# Patient Record
Sex: Male | Born: 1950
Health system: Southern US, Community
[De-identification: ages and names within clinical notes are randomized; demographics above are authoritative.]

## PROBLEM LIST (undated history)

## (undated) DIAGNOSIS — E785 Hyperlipidemia, unspecified: Secondary | ICD-10-CM

## (undated) DIAGNOSIS — I10 Essential (primary) hypertension: Secondary | ICD-10-CM

## (undated) DIAGNOSIS — I2510 Atherosclerotic heart disease of native coronary artery without angina pectoris: Secondary | ICD-10-CM

## (undated) HISTORY — DX: Hyperlipidemia, unspecified: E78.5

## (undated) HISTORY — DX: Atherosclerotic heart disease of native coronary artery without angina pectoris: I25.10

## (undated) HISTORY — DX: Essential (primary) hypertension: I10

---

## 2010-09-10 ENCOUNTER — Inpatient Hospital Stay (INDEPENDENT_AMBULATORY_CARE_PROVIDER_SITE_OTHER)
Admission: RE | Admit: 2010-09-10 | Discharge: 2010-09-10 | Disposition: A | Discharge status: Home / Self Care | Payer: 59 | Source: Ambulatory Visit | Attending: Family Medicine | Admitting: Family Medicine

## 2010-09-10 DIAGNOSIS — T6391XA Toxic effect of contact with unspecified venomous animal, accidental (unintentional), initial encounter: Secondary | ICD-10-CM

## 2010-09-10 DIAGNOSIS — L988 Other specified disorders of the skin and subcutaneous tissue: Secondary | ICD-10-CM

## 2016-06-10 DIAGNOSIS — Z0000 Encounter for general adult medical examination without abnormal findings: Secondary | ICD-10-CM | POA: Diagnosis not present

## 2016-06-21 DIAGNOSIS — Z1212 Encounter for screening for malignant neoplasm of rectum: Secondary | ICD-10-CM | POA: Diagnosis not present

## 2016-06-21 DIAGNOSIS — Z1211 Encounter for screening for malignant neoplasm of colon: Secondary | ICD-10-CM | POA: Diagnosis not present

## 2017-07-20 DIAGNOSIS — T161XXA Foreign body in right ear, initial encounter: Secondary | ICD-10-CM | POA: Diagnosis not present

## 2017-11-03 DIAGNOSIS — Z0000 Encounter for general adult medical examination without abnormal findings: Secondary | ICD-10-CM | POA: Diagnosis not present

## 2017-11-03 DIAGNOSIS — R030 Elevated blood-pressure reading, without diagnosis of hypertension: Secondary | ICD-10-CM | POA: Diagnosis not present

## 2017-12-20 DIAGNOSIS — H903 Sensorineural hearing loss, bilateral: Secondary | ICD-10-CM | POA: Diagnosis not present

## 2017-12-28 DIAGNOSIS — H903 Sensorineural hearing loss, bilateral: Secondary | ICD-10-CM | POA: Diagnosis not present

## 2018-08-08 DIAGNOSIS — T161XXD Foreign body in right ear, subsequent encounter: Secondary | ICD-10-CM | POA: Diagnosis not present

## 2018-11-09 DIAGNOSIS — Z125 Encounter for screening for malignant neoplasm of prostate: Secondary | ICD-10-CM | POA: Diagnosis not present

## 2018-11-09 DIAGNOSIS — Z0000 Encounter for general adult medical examination without abnormal findings: Secondary | ICD-10-CM | POA: Diagnosis not present

## 2019-01-03 DIAGNOSIS — U071 COVID-19: Secondary | ICD-10-CM | POA: Diagnosis not present

## 2019-01-03 DIAGNOSIS — J069 Acute upper respiratory infection, unspecified: Secondary | ICD-10-CM | POA: Diagnosis not present

## 2019-04-11 DIAGNOSIS — R69 Illness, unspecified: Secondary | ICD-10-CM | POA: Diagnosis not present

## 2019-06-14 ENCOUNTER — Emergency Department (HOSPITAL_COMMUNITY): Payer: Medicare HMO

## 2019-06-14 ENCOUNTER — Inpatient Hospital Stay (HOSPITAL_COMMUNITY)
Admission: EM | Admit: 2019-06-14 | Discharge: 2019-06-16 | DRG: 247 | Disposition: A | Discharge status: Home / Self Care | Payer: Medicare HMO | Attending: Family Medicine | Admitting: Family Medicine

## 2019-06-14 DIAGNOSIS — Z79899 Other long term (current) drug therapy: Secondary | ICD-10-CM

## 2019-06-14 DIAGNOSIS — I25110 Atherosclerotic heart disease of native coronary artery with unstable angina pectoris: Secondary | ICD-10-CM | POA: Diagnosis not present

## 2019-06-14 DIAGNOSIS — Z20822 Contact with and (suspected) exposure to covid-19: Secondary | ICD-10-CM | POA: Diagnosis present

## 2019-06-14 DIAGNOSIS — I10 Essential (primary) hypertension: Secondary | ICD-10-CM | POA: Diagnosis not present

## 2019-06-14 DIAGNOSIS — R001 Bradycardia, unspecified: Secondary | ICD-10-CM | POA: Diagnosis present

## 2019-06-14 DIAGNOSIS — R079 Chest pain, unspecified: Secondary | ICD-10-CM | POA: Diagnosis not present

## 2019-06-14 DIAGNOSIS — R0789 Other chest pain: Secondary | ICD-10-CM | POA: Diagnosis not present

## 2019-06-14 DIAGNOSIS — I200 Unstable angina: Secondary | ICD-10-CM | POA: Diagnosis not present

## 2019-06-14 DIAGNOSIS — I2584 Coronary atherosclerosis due to calcified coronary lesion: Secondary | ICD-10-CM | POA: Diagnosis present

## 2019-06-14 DIAGNOSIS — E782 Mixed hyperlipidemia: Secondary | ICD-10-CM | POA: Diagnosis not present

## 2019-06-14 DIAGNOSIS — I440 Atrioventricular block, first degree: Secondary | ICD-10-CM | POA: Diagnosis not present

## 2019-06-14 DIAGNOSIS — R0609 Other forms of dyspnea: Secondary | ICD-10-CM | POA: Diagnosis not present

## 2019-06-14 DIAGNOSIS — R072 Precordial pain: Secondary | ICD-10-CM

## 2019-06-14 DIAGNOSIS — R0602 Shortness of breath: Secondary | ICD-10-CM | POA: Diagnosis present

## 2019-06-14 DIAGNOSIS — Z955 Presence of coronary angioplasty implant and graft: Secondary | ICD-10-CM

## 2019-06-14 DIAGNOSIS — E785 Hyperlipidemia, unspecified: Secondary | ICD-10-CM | POA: Diagnosis present

## 2019-06-14 DIAGNOSIS — Z823 Family history of stroke: Secondary | ICD-10-CM

## 2019-06-14 DIAGNOSIS — Z7982 Long term (current) use of aspirin: Secondary | ICD-10-CM

## 2019-06-14 DIAGNOSIS — R0600 Dyspnea, unspecified: Secondary | ICD-10-CM | POA: Diagnosis not present

## 2019-06-14 DIAGNOSIS — I214 Non-ST elevation (NSTEMI) myocardial infarction: Secondary | ICD-10-CM

## 2019-06-14 LAB — CBC WITH DIFFERENTIAL/PLATELET
Abs Immature Granulocytes: 0.02 10*3/uL (ref 0.00–0.07)
Basophils Absolute: 0 10*3/uL (ref 0.0–0.1)
Basophils Relative: 1 %
Eosinophils Absolute: 0.1 10*3/uL (ref 0.0–0.5)
Eosinophils Relative: 1 %
HCT: 48.2 % (ref 39.0–52.0)
Hemoglobin: 15.7 g/dL (ref 13.0–17.0)
Immature Granulocytes: 0 %
Lymphocytes Relative: 37 %
Lymphs Abs: 2.7 10*3/uL (ref 0.7–4.0)
MCH: 29.9 pg (ref 26.0–34.0)
MCHC: 32.6 g/dL (ref 30.0–36.0)
MCV: 91.8 fL (ref 80.0–100.0)
Monocytes Absolute: 0.6 10*3/uL (ref 0.1–1.0)
Monocytes Relative: 8 %
Neutro Abs: 3.9 10*3/uL (ref 1.7–7.7)
Neutrophils Relative %: 53 %
Platelets: 242 10*3/uL (ref 150–400)
RBC: 5.25 MIL/uL (ref 4.22–5.81)
RDW: 12.6 % (ref 11.5–15.5)
WBC: 7.3 10*3/uL (ref 4.0–10.5)
nRBC: 0 % (ref 0.0–0.2)

## 2019-06-14 LAB — CK TOTAL AND CKMB (NOT AT ARMC)
CK, MB: 1.9 ng/mL (ref 0.5–5.0)
Relative Index: INVALID (ref 0.0–2.5)
Total CK: 90 U/L (ref 49–397)

## 2019-06-14 LAB — BASIC METABOLIC PANEL
Anion gap: 9 (ref 5–15)
BUN: 17 mg/dL (ref 8–23)
CO2: 22 mmol/L (ref 22–32)
Calcium: 9.9 mg/dL (ref 8.9–10.3)
Chloride: 107 mmol/L (ref 98–111)
Creatinine, Ser: 1.03 mg/dL (ref 0.61–1.24)
GFR calc Af Amer: >60 mL/min (ref >60)
GFR calc non Af Amer: >60 mL/min (ref >60)
Glucose, Bld: 94 mg/dL (ref 70–99)
Potassium: 4.2 mmol/L (ref 3.5–5.1)
Sodium: 138 mmol/L (ref 135–145)

## 2019-06-14 LAB — TROPONIN I (HIGH SENSITIVITY)
Troponin I (High Sensitivity): 22 ng/L — ABNORMAL HIGH (ref <18)
Troponin I (High Sensitivity): 27 ng/L — ABNORMAL HIGH (ref <18)
Troponin I (High Sensitivity): 22 ng/L — ABNORMAL HIGH (ref <18)
Troponin I (High Sensitivity): 26 ng/L — ABNORMAL HIGH (ref <18)

## 2019-06-14 LAB — HEMOGLOBIN A1C
Hgb A1c MFr Bld: 5.7 % — ABNORMAL HIGH (ref 4.8–5.6)
Mean Plasma Glucose: 116.89 mg/dL

## 2019-06-14 LAB — LIPID PANEL
Cholesterol: 236 mg/dL — ABNORMAL HIGH (ref 0–200)
HDL: 52 mg/dL (ref >40)
LDL Cholesterol: 170 mg/dL — ABNORMAL HIGH (ref 0–99)
Total CHOL/HDL Ratio: 4.5 RATIO
Triglycerides: 69 mg/dL (ref <150)
VLDL: 14 mg/dL (ref 0–40)

## 2019-06-14 LAB — TSH: TSH: 1.224 u[IU]/mL (ref 0.350–4.500)

## 2019-06-14 LAB — MAGNESIUM: Magnesium: 2.1 mg/dL (ref 1.7–2.4)

## 2019-06-14 LAB — HIV ANTIBODY (ROUTINE TESTING W REFLEX): HIV Screen 4th Generation wRfx: NONREACTIVE

## 2019-06-14 LAB — MRSA PCR SCREENING: MRSA by PCR: NEGATIVE

## 2019-06-14 MED ORDER — AMLODIPINE BESYLATE 5 MG PO TABS
5.0000 mg | ORAL_TABLET | Freq: Every day | ORAL | Status: DC
  Administered 2019-06-15 – 2019-06-16 (×2): 5 mg via ORAL
  Filled 2019-06-14 (×2): qty 1

## 2019-06-14 MED ORDER — SODIUM CHLORIDE 0.9% FLUSH: 3.0000 mL | INTRAVENOUS | Status: DC | PRN

## 2019-06-14 MED ORDER — AMLODIPINE BESYLATE 5 MG PO TABS
2.5000 mg | ORAL_TABLET | Freq: Every day | ORAL | Status: DC
  Administered 2019-06-14: 2.5 mg via ORAL
  Filled 2019-06-14: qty 1

## 2019-06-14 MED ORDER — HEPARIN (PORCINE) 25000 UT/250ML-% IV SOLN
1100.0000 [IU]/h | INTRAVENOUS | Status: DC
  Administered 2019-06-14: 1100 [IU]/h via INTRAVENOUS
  Filled 2019-06-14: qty 250

## 2019-06-14 MED ORDER — AMLODIPINE BESYLATE 5 MG PO TABS: 2.5000 mg | ORAL_TABLET | Freq: Every day | ORAL | Status: DC

## 2019-06-14 MED ORDER — SODIUM CHLORIDE 0.9 % WEIGHT BASED INFUSION
3.0000 mL/kg/h | INTRAVENOUS | Status: AC
  Administered 2019-06-15: 3 mL/kg/h via INTRAVENOUS

## 2019-06-14 MED ORDER — SODIUM CHLORIDE 0.9 % WEIGHT BASED INFUSION
1.0000 mL/kg/h | INTRAVENOUS | Status: DC
  Administered 2019-06-15: 1 mL/kg/h via INTRAVENOUS

## 2019-06-14 MED ORDER — SODIUM CHLORIDE 0.9% FLUSH
3.0000 mL | Freq: Two times a day (BID) | INTRAVENOUS | Status: DC
  Administered 2019-06-14 – 2019-06-15 (×2): 3 mL via INTRAVENOUS

## 2019-06-14 MED ORDER — HEPARIN SODIUM (PORCINE) 5000 UNIT/ML IJ SOLN
5000.0000 [IU] | Freq: Three times a day (TID) | INTRAMUSCULAR | Status: DC
  Administered 2019-06-14: 5000 [IU] via SUBCUTANEOUS
  Filled 2019-06-14: qty 1

## 2019-06-14 MED ORDER — SODIUM CHLORIDE 0.9 % IV SOLN: 250.0000 mL | INTRAVENOUS | Status: DC | PRN

## 2019-06-14 MED ORDER — ATORVASTATIN CALCIUM 80 MG PO TABS
80.0000 mg | ORAL_TABLET | Freq: Every day | ORAL | Status: DC
  Administered 2019-06-14 – 2019-06-16 (×3): 80 mg via ORAL
  Filled 2019-06-14 (×3): qty 1

## 2019-06-14 MED ORDER — HEPARIN BOLUS VIA INFUSION
1000.0000 [IU] | Freq: Once | INTRAVENOUS | Status: AC
  Administered 2019-06-14: 1000 [IU] via INTRAVENOUS
  Filled 2019-06-14: qty 1000

## 2019-06-14 MED ORDER — ATORVASTATIN CALCIUM 80 MG PO TABS: 80.0000 mg | ORAL_TABLET | Freq: Every day | ORAL | Status: DC

## 2019-06-14 MED ORDER — ENOXAPARIN SODIUM 40 MG/0.4ML ~~LOC~~ SOLN: 40.0000 mg | SUBCUTANEOUS | Status: DC

## 2019-06-14 MED ORDER — ASPIRIN 81 MG PO CHEW
81.0000 mg | CHEWABLE_TABLET | ORAL | Status: AC
  Administered 2019-06-15: 81 mg via ORAL
  Filled 2019-06-14: qty 1

## 2019-06-14 MED ORDER — ASPIRIN EC 81 MG PO TBEC
81.0000 mg | DELAYED_RELEASE_TABLET | Freq: Every day | ORAL | Status: DC
  Administered 2019-06-16: 81 mg via ORAL
  Filled 2019-06-14 (×3): qty 1

## 2019-06-14 NOTE — Progress Notes (Signed)
ANTICOAGULATION CONSULT NOTE  Pharmacy Consult for Heparin Indication: chest pain/ACS  Allergies  Allergen Reactions  . Bee Venom     Patient Measurements: Height: 5\' 10"  (177.8 cm) Weight: 91.4 kg (201 lb 8 oz) IBW/kg (Calculated) : 73 Heparin Dosing Weight: 91.3  Vital Signs: Temp: 97.7 F (36.5 C) (04/22 1319) Temp Source: Oral (04/22 1319) BP: 163/100 (04/22 1815) Pulse Rate: 51 (04/22 1845)  Labs: Recent Labs    06/14/19 1359 06/14/19 1630  HGB 15.7  --   HCT 48.2  --   PLT 242  --   CREATININE 1.03  --   TROPONINIHS 22* 27*    Estimated Creatinine Clearance: 78.1 mL/min (by C-G formula based on SCr of 1.03 mg/dL).   Medical History: No past medical history on file.  Medications:  Scheduled:  . [START ON 06/15/2019] amLODipine  5 mg Oral Daily  . [START ON 06/15/2019] aspirin EC  81 mg Oral Daily  . atorvastatin  80 mg Oral Daily  . heparin  1,000 Units Intravenous Once    Assessment: Patient is a 58 yom that is being admitted for chest pain. Patient is planned for a cath tomorrow. Pharmacy has been asked to dose heparin at this time for chest pain/ACS.  Goal of Therapy:  Heparin level 0.3-0.7 units/ml Monitor platelets by anticoagulation protocol: Yes   Plan:  - 5000 units of SubQ heparin was administered at ~ 1800, with this will give a significantly reduced bolus of heparin - Heparin bolus 1000 units IV x 1 dose - Heparin drip @ 1100 units/hr - Heparin level in ~ 6 hours  - Monitor patient for s/s of bleeding and CBC while on heparin   73 PharmD. BCPS  06/14/2019,7:18 PM

## 2019-06-14 NOTE — H&P (Addendum)
Family Medicine [facility] Admission History and Physical Service Pager: [phone]  Patient name: Joseph Jenkins Medical record number: [id number] Date of birth: 08-10-1950 Age: 69 y.o. Gender: male  Primary Care Provider: Barbie Banner, MD Consultants: None Code Status: Full Preferred Emergency Contact: Marcelino Scot [phone]  Chief Complaint: Chest pain, SOB  Assessment and Plan: Joseph Jenkins is a 69 y.o. male presenting with exertional chest pain/SOB ongoing x 1 week. PMH is significant for Hyperlipidemia not any antihypertensives.  Exertional Chest Pain/SOB c/f Unstable Angina: Reports one week history of worsening exertional chest pressure and SOB that occurred with short distances, lasting 1-2 mins and relieved with rest. Most concerning differential include unstable angina. Risk factors include HTN and HLD - labs significant for slight increase in Troponin 22, LDL 170, Chol 236 and HbA1c 5.7.  EKG shows sinus brady without ST elevation or depression.  Chest xray negative for active disease. TIMI score 4 which indicates 20% risk at 14 days all-cause mortality; Heart Score 4 indicating moderate risk of an adverse cardiac advent.  Denies any chest pain or SOB currently. No chest tenderness on palpation. Other differentials considered but determined to be less consistent with H&P include GERD, esophageal spasm, prinzmetal. Cards consulted. -Admit to Cardiac Tele, Attending Dr. Pollie Meyer -Cardiology consult, appreciated recommendations  -Start Amlodipine 5mg  daily per cards recs, No beta blockers -Consult pharm for Heparin gtt per Cards -Plan for heart cath in am -Start Atorvastatin 80 mg daily -Trend Troponin's -CK and CKMB, TSH -continuous cardiac monitoring -Vital signs per unit -Repeat EKG in am - ASA  HTN: BP elevated 140-150's/80's on admission. No prior history of HTN. Discussed with cardiology who agreed with starting Amlodipine for it's antihypertensive  and antianginal effect. Avoid beta-blockers given underlying bradycardia. - start Amlodipine 5mg  QD  HLD: Lipid panel notable for Chol 236, HDL 52, LDL 170, Trig 69. Not currently on any anti-cholesterol medications. - Start Atorvastatin 80mg  QD  FEN/GI: -Heart Healthy -NPO after midnight Prophylaxis:  -Heparin dtt  Disposition: Cardiac Tele, Attending Dr.  History of Present Illness:  Joseph Jenkins is a 69 y.o. male presenting with worsening exertional chest pain and SOB. He notes it all started about 6-7 days ago when he was working in the yard. He experienced some chest pain and worsening SOB. He notes the chest pain occurred about 5-10 mins into his exertion. Denies any diaphoresis, nausea, vomiting, arm/jaw pain. He has had several more occurences in the following days when he was working in the yard and when he went for a walk with his wife. Patient notes he was working in the yard on [place] and had similar symptoms that occurred with less exertion, then again on Tuesday on a walk with his wife. Notes that the pain occurs across the top of his chest, "feels like a burning, and pressure in my chest". Felt extremely short of breath - had to breath heavy for air.  Once he stopped exerting himself it would resolve in about 1-2 mins. Denies any association with food. Denies any chest pain at rest. Denies history of anemia. Denies any history of thyroid issues.  He notes that he is a 73 at [employer]. He does not take any medications. Is pretty active. He is a never smoker. Denies any illicit drug use. Denies any history of pulmonary diseases such as asthma.   Surgical history: Excision of lipoma from neck Family History: stroke in father in 75's  He had COVID in November 2020. He  has not had the vaccine.   Review Of Systems: Per HPI with the following additions:   Review of Systems  Constitutional: Negative for chills, diaphoresis, fatigue and fever.  HENT: Negative  for congestion and sore throat.   Respiratory: Positive for chest tightness and shortness of breath. Negative for cough and wheezing.   Cardiovascular: Positive for chest pain.  Gastrointestinal: Negative for constipation, diarrhea, nausea and vomiting.  Genitourinary: Negative for hematuria.  Neurological: Negative for dizziness and headaches.     Patient Active Problem List   Diagnosis Date Noted  . SOB (shortness of breath) on exertion 06/14/2019    Past Medical History: No past medical history on file.  Past Surgical History: Excision of Lipoma   Social History: Social History   Tobacco Use  . Smoking status: Not on file  Substance Use Topics  . Alcohol use: Not on file  . Drug use: Not on file   Additional social history: Nonsmoker, no alcohol, no illicit drugs Please also refer to relevant sections of EMR.  Family History: Father: stroke at 14's  Allergies and Medications: Allergies  Allergen Reactions  . Bee Venom    No current facility-administered medications on file prior to encounter.   Current Outpatient Medications on File Prior to Encounter  Medication Sig Dispense Refill  . aspirin EC 81 MG tablet Take 81 mg by mouth daily.    . Lactobacillus (PROBIOTIC ACIDOPHILUS PO) Take 1 tablet by mouth.      Objective: BP (!) 189/93   Pulse (!) 50   Temp 97.7 F (36.5 C) (Oral)   Resp 17   SpO2 100%  Exam: General: Alert and oriented, no apparent distress  Eyes: PEERLA ENTM: No pharyengeal erythema Neck: nontender, no elevated JVD or carotid bruits appreciated Cardiovascular: Regular rhythm and SB with no murmurs or gallops appreciated Respiratory: CTA bilaterally  Gastrointestinal: Bowel sounds present. No abdominal pain MSK: Upper extremity strength 5/5 bilaterally, Lower extremity strength 5/5 bilaterally  Psych: Behavior and speech appropriate to situation  Labs and Imaging: CBC BMET  Recent Labs  Lab 06/14/19 1359  WBC 7.3  HGB 15.7   HCT 48.2  PLT 242   Recent Labs  Lab 06/14/19 1359  NA 138  K 4.2  CL 107  CO2 22  BUN 17  CREATININE 1.03  GLUCOSE 94  CALCIUM 9.9     EKG: Sinus brady, no signs of ischemia  DG Chest 2 View  Result Date: 06/14/2019 CLINICAL DATA:  Chest pain and shortness of breath for 5-6 days. EXAM: CHEST - 2 VIEW COMPARISON:  None. FINDINGS: Lungs clear. Heart size normal. No pneumothorax or pleural effusion. No acute or focal bony abnormality. IMPRESSION: Negative chest. Electronically Signed   By: Inge Rise M.D.   On: 06/14/2019 14:39    Carollee Leitz, MD 06/14/2019, 4:44 PM PGY-1, [place] Intern pager: [phone], text pages welcome  Upper Level Addendum:  I have seen and evaluated this patient along with Dr. Volanda Napoleon and reviewed the above note, making necessary revisions in green.   Mina Marble, D.O. 06/14/2019, 9:04 PM PGY-2, [place]

## 2019-06-14 NOTE — Consult Note (Addendum)
Cardiology Consultation:   Patient ID: Joseph Jenkins MRN: [id number]; DOB: Nov 17, 1950  Admit date: 06/14/2019 Date of Consult: 06/14/2019  Primary Care Provider: Barbie Banner, MD Primary Cardiologist: New patient/Dr Delton See  Patient Profile:   Joseph Jenkins is a 69 y.o. male with a hx of hypertension who is being seen today for the evaluation of chest pain at the request of Dr. Dana Allan.  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  Patient states he was in his usual state of health until last Thursday when he started to develop exertional chest pressure and shortness of breath after walking very short distances.  Does resolve at rest.  Ever since then any exertion leads to retrosternal chest pressure necessity to stop.  Prior to this he was very active going to the gym and walking about 6 miles a week with his wife and was completely asymptomatic.  Patient does not have a family history of premature coronary artery disease and has never smoked.  He has a history of hypertension treated with amlodipine, he recently had his cholesterol checked and was told it was within normal limits.  He has not had any recent other medical issues, no fever chills nausea vomiting lower extremity edema orthopnea or proximal nocturnal dyspnea.  No palpitations or syncope.   No past medical history on file.  Inpatient Medications: Scheduled Meds: . amLODipine  2.5 mg Oral Daily  . [START ON 06/15/2019] aspirin EC  81 mg Oral Daily  . heparin  5,000 Units Subcutaneous Q8H   Continuous Infusions:  PRN Meds:   Allergies:    Allergies  Allergen Reactions  . Bee Venom     Social History:   Social History   Socioeconomic History  . Marital status: Married    Spouse name: Not on file  . Number of children: Not on  file  . Years of education: Not on file  . Highest education level: Not on file  Occupational History  . Not on file  Tobacco Use  . Smoking status: Not on file  Substance and Sexual Activity  . Alcohol use: Not on file  . Drug use: Not on file  . Sexual activity: Not on file  Other Topics Concern  . Not on file  Social History Narrative  . Not on file   Social Determinants of Health   Financial Resource Strain:   . Difficulty of Paying Living Expenses:   Food Insecurity:   . Worried About Programme researcher, broadcasting/film/video in the Last Year:   . Barista in the Last Year:   Transportation Needs:   . Freight forwarder (Medical):   Marland Kitchen Lack of Transportation (Non-Medical):   Physical Activity:   . Days of Exercise per Week:   . Minutes of Exercise per Session:   Stress:   . Feeling of Stress :   Social Connections:   . Frequency of Communication with Friends and Family:   . Frequency of Social Gatherings with Friends and Family:   . Attends Religious Services:   . Active Member of Clubs or Organizations:   . Attends Banker Meetings:   Marland Kitchen Marital Status:   Intimate Partner Violence:   . Fear of Current or Ex-Partner:   . Emotionally Abused:   Marland Kitchen Physically Abused:   .  Sexually Abused:     Family History:   No family history of coronary artery disease or sudden cardiac death.  ROS:  Please see the history of present illness.  All other ROS reviewed and negative.     Physical Exam/Data:   Vitals:   06/14/19 1630 06/14/19 1645 06/14/19 1700 06/14/19 1815  BP: (!) 146/79 (!) 162/96 (!) 151/83 (!) 163/100  Pulse: (!) 51 (!) 50 (!) 49 (!) 54  Resp:      Temp:      TempSrc:      SpO2: 100% 96% 95% 100%   No intake or output data in the 24 hours ending 06/14/19 1847 No flowsheet data found.   There is no height or weight on file to calculate BMI.  General:  Well nourished, well developed, in no acute distress HEENT: normal Lymph: no adenopathy Neck: no  JVD Endocrine:  No thryomegaly Vascular: No carotid bruits; FA pulses 2+ bilaterally without bruits  Cardiac:  normal S1, S2; RRR; no murmur  Lungs:  clear to auscultation bilaterally, no wheezing, rhonchi or rales  Abd: soft, nontender, no hepatomegaly  Ext: no edema Musculoskeletal:  No deformities, BUE and BLE strength normal and equal Skin: warm and dry  Neuro:  CNs 2-12 intact, no focal abnormalities noted Psych:  Normal affect   EKG:  The EKG was personally reviewed and demonstrates: Sinus bradycardia with nonspecific ST-T abnormalities. Telemetry:  Telemetry was personally reviewed and demonstrates:  SB to SR  Relevant CV Studies: Laboratory Data:  High Sensitivity Troponin:   Recent Labs  Lab 06/14/19 1359  TROPONINIHS 22*     Chemistry Recent Labs  Lab 06/14/19 1359  NA 138  K 4.2  CL 107  CO2 22  GLUCOSE 94  BUN 17  CREATININE 1.03  CALCIUM 9.9  GFRNONAA >60  GFRAA >60  ANIONGAP 9    No results for input(s): PROT, ALBUMIN, AST, ALT, ALKPHOS, BILITOT in the last 168 hours. Hematology Recent Labs  Lab 06/14/19 1359  WBC 7.3  RBC 5.25  HGB 15.7  HCT 48.2  MCV 91.8  MCH 29.9  MCHC 32.6  RDW 12.6  PLT 242   BNPNo results for input(s): BNP, PROBNP in the last 168 hours.  DDimer No results for input(s): DDIMER in the last 168 hours.   Radiology/Studies:  DG Chest 2 View  Result Date: 06/14/2019 CLINICAL DATA:  Chest pain and shortness of breath for 5-6 days. EXAM: CHEST - 2 VIEW COMPARISON:  None. FINDINGS: Lungs clear. Heart size normal. No pneumothorax or pleural effusion. No acute or focal bony abnormality. IMPRESSION: Negative chest. Electronically Signed   By: Inge Rise M.D.   On: 06/14/2019 14:39    Assessment and Plan:   1. Chest pain 1. Highly suspicious for unstable angina, risk factors include hypertension and hyperlipidemia that is untreated, we will plan for cardiac catheterization in the morning.  Will start aspirin and  high-dose atorvastatin.  No beta-blockers as he is hypotensive at baseline.  We will increase amlodipine to 5 mg daily.  Hypertension -increase amlodipine to 5 mg daily  Hyperlipidemia -start atorvastatin 80 mg po QHS, LDL today 170   For questions or updates, please contact [place] Please consult [email or web address] for contact info under   Signed, Ena Dawley, MD  06/14/2019 6:47 PM

## 2019-06-14 NOTE — ED Provider Notes (Signed)
[facility] EMERGENCY DEPARTMENT Provider Note   CSN: [id number] Arrival date & time: 06/14/19  1300     History Chief Complaint  Patient presents with  . Chest Pain  . Shortness of Breath    Joseph Jenkins is a 69 y.o. male.  HPI Patient presents to the emergency department with worsening exertional shortness of breath and chest pain over the last week.  The patient states over the last few days has gotten significantly worse for even slight test causing to be short of breath and had chest pain.  Patient states that he saw his doctor today who sent him here for further evaluation and management.  The patient states that at rest he does not feel the symptoms.  He states that the symptoms will subside gradually once he does rest after they occur.  States that nothing seems to make the condition significantly better.  The patient denies  headache,blurred vision, neck pain, fever, cough, weakness, numbness, dizziness, anorexia, edema, abdominal pain, nausea, vomiting, diarrhea, rash, back pain, dysuria, hematemesis, bloody stool, near syncope, or syncope.  No past medical history on file.  There are no problems to display for this patient.       No family history on file.  Social History   Tobacco Use  . Smoking status: Not on file  Substance Use Topics  . Alcohol use: Not on file  . Drug use: Not on file    Home Medications Prior to Admission medications   Not on File    Allergies    Bee venom  Review of Systems   Review of Systems All other systems negative except as documented in the HPI. All pertinent positives and negatives as reviewed in the HPI. Physical Exam Updated Vital Signs BP (!) 189/93   Pulse (!) 55   Temp 97.7 F (36.5 C) (Oral)   Resp 17   SpO2 100%   Physical Exam Vitals and nursing note reviewed.  Constitutional:      General: He is not in acute distress.    Appearance: He is well-developed.  HENT:     Head:  Normocephalic and atraumatic.  Eyes:     Pupils: Pupils are equal, round, and reactive to light.  Cardiovascular:     Rate and Rhythm: Normal rate and regular rhythm.     Heart sounds: Normal heart sounds. No murmur. No friction rub. No gallop.   Pulmonary:     Effort: Pulmonary effort is normal. No respiratory distress.     Breath sounds: Normal breath sounds. No wheezing.  Abdominal:     General: Bowel sounds are normal. There is no distension.     Palpations: Abdomen is soft.     Tenderness: There is no abdominal tenderness.  Musculoskeletal:     Cervical back: Normal range of motion and neck supple.  Skin:    General: Skin is warm and dry.     Capillary Refill: Capillary refill takes less than 2 seconds.     Findings: No erythema or rash.  Neurological:     Mental Status: He is alert and oriented to person, place, and time.     Motor: No abnormal muscle tone.     Coordination: Coordination normal.  Psychiatric:        Behavior: Behavior normal.     ED Results / Procedures / Treatments   Labs (all labs ordered are listed, but only abnormal results are displayed) Labs Reviewed  TROPONIN I (HIGH SENSITIVITY) - Abnormal;  Notable for the following components:      Result Value   Troponin I (High Sensitivity) 22 (*)    All other components within normal limits  BASIC METABOLIC PANEL  CBC WITH DIFFERENTIAL/PLATELET    EKG EKG Interpretation  Date/Time:  Thursday June 14 2019 13:13:25 EDT Ventricular Rate:  50 PR Interval:    QRS Duration: 102 QT Interval:  462 QTC Calculation: 422 R Axis:   65 Text Interpretation: Sinus bradycardia Non-specific ST-t changes No previous tracing Confirmed by Cathren Laine ([id number]) on 06/14/2019 3:20:27 PM   Radiology DG Chest 2 View  Result Date: 06/14/2019 CLINICAL DATA:  Chest pain and shortness of breath for 5-6 days. EXAM: CHEST - 2 VIEW COMPARISON:  None. FINDINGS: Lungs clear. Heart size normal. No pneumothorax or pleural  effusion. No acute or focal bony abnormality. IMPRESSION: Negative chest. Electronically Signed   By: Drusilla Kanner M.D.   On: 06/14/2019 14:39    Procedures Procedures (including critical care time)  Medications Ordered in ED Medications - No data to display  ED Course  I have reviewed the triage vital signs and the nursing notes.  Pertinent labs & imaging results that were available during my care of the patient were reviewed by me and considered in my medical decision making (see chart for details).    MDM Rules/Calculators/A&P                      I spoke with the family medicine residents about admitting patient for further evaluation and care.  The patient does have concerning symptoms for the cardiac etiology for this chest pain.  I did speak with cardiology about the recommendations and they advised to have the admitting team consult them if he is admitted.  Patient has been stable here in the emergency department.  Final Clinical Impression(s) / ED Diagnoses Final diagnoses:  None    Rx / DC Orders ED Discharge Orders    None       Charlestine Night, PA-C 06/14/19 1530    Cathren Laine, MD 06/15/19 1444

## 2019-06-14 NOTE — ED Triage Notes (Signed)
Pt brought in from PCP's office via EMS for exertional CP with shortness of breath for the past 5-6 days. Pt had EKG performed at PCP which showed SB. Pt was referred here for possible stress test. Pt currently in no distress.

## 2019-06-14 NOTE — ED Notes (Addendum)
Paged resident attending, Dr. Dana Allan, regarding discontinuation of troponin -- wanted to clarify.

## 2019-06-15 ENCOUNTER — Encounter (HOSPITAL_COMMUNITY)
Admission: EM | Disposition: A | Discharge status: Home / Self Care | Payer: Self-pay | Source: Home / Self Care | Attending: Family Medicine

## 2019-06-15 ENCOUNTER — Ambulatory Visit (HOSPITAL_COMMUNITY): Admit: 2019-06-15 | Payer: 59 | Admitting: Cardiology

## 2019-06-15 ENCOUNTER — Observation Stay (HOSPITAL_COMMUNITY): Payer: Medicare HMO

## 2019-06-15 DIAGNOSIS — R0789 Other chest pain: Secondary | ICD-10-CM | POA: Diagnosis present

## 2019-06-15 DIAGNOSIS — I25110 Atherosclerotic heart disease of native coronary artery with unstable angina pectoris: Secondary | ICD-10-CM | POA: Diagnosis not present

## 2019-06-15 DIAGNOSIS — R0602 Shortness of breath: Secondary | ICD-10-CM | POA: Diagnosis not present

## 2019-06-15 DIAGNOSIS — R072 Precordial pain: Secondary | ICD-10-CM | POA: Diagnosis not present

## 2019-06-15 DIAGNOSIS — Z823 Family history of stroke: Secondary | ICD-10-CM | POA: Diagnosis not present

## 2019-06-15 DIAGNOSIS — E782 Mixed hyperlipidemia: Secondary | ICD-10-CM | POA: Diagnosis not present

## 2019-06-15 DIAGNOSIS — R001 Bradycardia, unspecified: Secondary | ICD-10-CM | POA: Diagnosis not present

## 2019-06-15 DIAGNOSIS — I10 Essential (primary) hypertension: Secondary | ICD-10-CM | POA: Diagnosis not present

## 2019-06-15 DIAGNOSIS — I341 Nonrheumatic mitral (valve) prolapse: Secondary | ICD-10-CM

## 2019-06-15 DIAGNOSIS — I200 Unstable angina: Secondary | ICD-10-CM | POA: Diagnosis not present

## 2019-06-15 DIAGNOSIS — I214 Non-ST elevation (NSTEMI) myocardial infarction: Secondary | ICD-10-CM | POA: Diagnosis not present

## 2019-06-15 DIAGNOSIS — Z7982 Long term (current) use of aspirin: Secondary | ICD-10-CM | POA: Diagnosis not present

## 2019-06-15 DIAGNOSIS — E785 Hyperlipidemia, unspecified: Secondary | ICD-10-CM | POA: Diagnosis not present

## 2019-06-15 DIAGNOSIS — I2510 Atherosclerotic heart disease of native coronary artery without angina pectoris: Secondary | ICD-10-CM

## 2019-06-15 DIAGNOSIS — Z955 Presence of coronary angioplasty implant and graft: Secondary | ICD-10-CM | POA: Diagnosis not present

## 2019-06-15 DIAGNOSIS — E7800 Pure hypercholesterolemia, unspecified: Secondary | ICD-10-CM | POA: Diagnosis not present

## 2019-06-15 DIAGNOSIS — I361 Nonrheumatic tricuspid (valve) insufficiency: Secondary | ICD-10-CM | POA: Diagnosis not present

## 2019-06-15 DIAGNOSIS — Z79899 Other long term (current) drug therapy: Secondary | ICD-10-CM | POA: Diagnosis not present

## 2019-06-15 DIAGNOSIS — I440 Atrioventricular block, first degree: Secondary | ICD-10-CM | POA: Diagnosis present

## 2019-06-15 DIAGNOSIS — Z20822 Contact with and (suspected) exposure to covid-19: Secondary | ICD-10-CM | POA: Diagnosis not present

## 2019-06-15 DIAGNOSIS — I2584 Coronary atherosclerosis due to calcified coronary lesion: Secondary | ICD-10-CM | POA: Diagnosis not present

## 2019-06-15 HISTORY — PX: LEFT HEART CATH AND CORONARY ANGIOGRAPHY: CATH118249

## 2019-06-15 HISTORY — PX: CORONARY STENT INTERVENTION: CATH118234

## 2019-06-15 LAB — BASIC METABOLIC PANEL
Anion gap: 8 (ref 5–15)
BUN: 16 mg/dL (ref 8–23)
CO2: 24 mmol/L (ref 22–32)
Calcium: 10 mg/dL (ref 8.9–10.3)
Chloride: 107 mmol/L (ref 98–111)
Creatinine, Ser: 1.12 mg/dL (ref 0.61–1.24)
GFR calc Af Amer: >60 mL/min (ref >60)
GFR calc non Af Amer: >60 mL/min (ref >60)
Glucose, Bld: 100 mg/dL — ABNORMAL HIGH (ref 70–99)
Potassium: 4.3 mmol/L (ref 3.5–5.1)
Sodium: 139 mmol/L (ref 135–145)

## 2019-06-15 LAB — CBC
HCT: 46.4 % (ref 39.0–52.0)
Hemoglobin: 15.5 g/dL (ref 13.0–17.0)
MCH: 29.9 pg (ref 26.0–34.0)
MCHC: 33.4 g/dL (ref 30.0–36.0)
MCV: 89.6 fL (ref 80.0–100.0)
Platelets: 221 10*3/uL (ref 150–400)
RBC: 5.18 MIL/uL (ref 4.22–5.81)
RDW: 12.4 % (ref 11.5–15.5)
WBC: 7.4 10*3/uL (ref 4.0–10.5)
nRBC: 0 % (ref 0.0–0.2)

## 2019-06-15 LAB — ECHOCARDIOGRAM COMPLETE
Height: 70 in
Weight: 3188.73 oz

## 2019-06-15 LAB — POCT ACTIVATED CLOTTING TIME: Activated Clotting Time: 290 seconds

## 2019-06-15 LAB — HEPARIN LEVEL (UNFRACTIONATED)
Heparin Unfractionated: 0.61 IU/mL (ref 0.30–0.70)
Heparin Unfractionated: 0.6 IU/mL (ref 0.30–0.70)

## 2019-06-15 LAB — TROPONIN I (HIGH SENSITIVITY): Troponin I (High Sensitivity): 25 ng/L — ABNORMAL HIGH (ref <18)

## 2019-06-15 LAB — SARS CORONAVIRUS 2 (TAT 6-24 HRS): SARS Coronavirus 2: NEGATIVE

## 2019-06-15 SURGERY — LEFT HEART CATH AND CORONARY ANGIOGRAPHY
Anesthesia: LOCAL

## 2019-06-15 MED ORDER — TICAGRELOR 90 MG PO TABS
90.0000 mg | ORAL_TABLET | Freq: Two times a day (BID) | ORAL | Status: DC
  Administered 2019-06-16: 90 mg via ORAL
  Filled 2019-06-15 (×2): qty 1

## 2019-06-15 MED ORDER — FENTANYL CITRATE (PF) 100 MCG/2ML IJ SOLN
INTRAMUSCULAR | Status: AC
  Filled 2019-06-15: qty 2

## 2019-06-15 MED ORDER — MIDAZOLAM HCL 2 MG/2ML IJ SOLN
INTRAMUSCULAR | Status: DC | PRN
  Administered 2019-06-15: 2 mg via INTRAVENOUS

## 2019-06-15 MED ORDER — TICAGRELOR 90 MG PO TABS
ORAL_TABLET | ORAL | Status: DC | PRN
  Administered 2019-06-15: 180 mg via ORAL

## 2019-06-15 MED ORDER — ONDANSETRON HCL 4 MG/2ML IJ SOLN: 4.0000 mg | Freq: Four times a day (QID) | INTRAMUSCULAR | Status: DC | PRN

## 2019-06-15 MED ORDER — VERAPAMIL HCL 2.5 MG/ML IV SOLN
INTRAVENOUS | Status: AC
  Filled 2019-06-15: qty 2

## 2019-06-15 MED ORDER — SODIUM CHLORIDE 0.9% FLUSH
3.0000 mL | Freq: Two times a day (BID) | INTRAVENOUS | Status: DC
  Administered 2019-06-16: 3 mL via INTRAVENOUS

## 2019-06-15 MED ORDER — NITROGLYCERIN 1 MG/10 ML FOR IR/CATH LAB
INTRA_ARTERIAL | Status: AC
  Filled 2019-06-15: qty 10

## 2019-06-15 MED ORDER — FENTANYL CITRATE (PF) 100 MCG/2ML IJ SOLN
INTRAMUSCULAR | Status: DC | PRN
  Administered 2019-06-15: 25 ug via INTRAVENOUS

## 2019-06-15 MED ORDER — SODIUM CHLORIDE 0.9 % WEIGHT BASED INFUSION: 1.0000 mL/kg/h | INTRAVENOUS | Status: AC

## 2019-06-15 MED ORDER — HEPARIN SODIUM (PORCINE) 1000 UNIT/ML IJ SOLN
INTRAMUSCULAR | Status: DC | PRN
  Administered 2019-06-15: 5000 [IU] via INTRAVENOUS
  Administered 2019-06-15: 4000 [IU] via INTRAVENOUS

## 2019-06-15 MED ORDER — SODIUM CHLORIDE 0.9% FLUSH: 3.0000 mL | INTRAVENOUS | Status: DC | PRN

## 2019-06-15 MED ORDER — ACETAMINOPHEN 325 MG PO TABS: 650.0000 mg | ORAL_TABLET | ORAL | Status: DC | PRN

## 2019-06-15 MED ORDER — IOHEXOL 350 MG/ML SOLN
INTRAVENOUS | Status: DC | PRN
  Administered 2019-06-15: 100 mL via INTRA_ARTERIAL

## 2019-06-15 MED ORDER — HEPARIN (PORCINE) IN NACL 1000-0.9 UT/500ML-% IV SOLN
INTRAVENOUS | Status: DC | PRN
  Administered 2019-06-15 (×2): 500 mL

## 2019-06-15 MED ORDER — HYDRALAZINE HCL 20 MG/ML IJ SOLN: 10.0000 mg | INTRAMUSCULAR | Status: AC | PRN

## 2019-06-15 MED ORDER — LIDOCAINE HCL (PF) 1 % IJ SOLN
INTRAMUSCULAR | Status: DC | PRN
  Administered 2019-06-15: 2 mL via INTRADERMAL

## 2019-06-15 MED ORDER — HEPARIN (PORCINE) IN NACL 1000-0.9 UT/500ML-% IV SOLN
INTRAVENOUS | Status: AC
  Filled 2019-06-15: qty 1000

## 2019-06-15 MED ORDER — MIDAZOLAM HCL 2 MG/2ML IJ SOLN
INTRAMUSCULAR | Status: AC
  Filled 2019-06-15: qty 2

## 2019-06-15 MED ORDER — VERAPAMIL HCL 2.5 MG/ML IV SOLN
INTRAVENOUS | Status: DC | PRN
  Administered 2019-06-15: 10 mL via INTRA_ARTERIAL

## 2019-06-15 MED ORDER — NITROGLYCERIN 1 MG/10 ML FOR IR/CATH LAB
INTRA_ARTERIAL | Status: DC | PRN
  Administered 2019-06-15 (×2): 150 ug via INTRACORONARY

## 2019-06-15 MED ORDER — SODIUM CHLORIDE 0.9 % IV SOLN: 250.0000 mL | INTRAVENOUS | Status: DC | PRN

## 2019-06-15 MED ORDER — LABETALOL HCL 5 MG/ML IV SOLN: 10.0000 mg | INTRAVENOUS | Status: AC | PRN

## 2019-06-15 MED ORDER — TICAGRELOR 90 MG PO TABS
ORAL_TABLET | ORAL | Status: AC
  Filled 2019-06-15: qty 2

## 2019-06-15 SURGICAL SUPPLY — 16 items
BALLN SAPPHIRE 2.0X15 (BALLOONS) ×2
BALLN SAPPHIRE ~~LOC~~ 3.5X18 (BALLOONS) ×1 IMPLANT
BALLOON SAPPHIRE 2.0X15 (BALLOONS) IMPLANT
CATH 5FR JL3.5 JR4 ANG PIG MP (CATHETERS) ×1 IMPLANT
CATH VISTA GUIDE 6FR XBLAD3.5 (CATHETERS) ×1 IMPLANT
DEVICE RAD COMP TR BAND LRG (VASCULAR PRODUCTS) ×1 IMPLANT
GLIDESHEATH SLEND SS 6F .021 (SHEATH) ×1 IMPLANT
GUIDEWIRE INQWIRE 1.5J.035X260 (WIRE) IMPLANT
INQWIRE 1.5J .035X260CM (WIRE) ×2
KIT ENCORE 26 ADVANTAGE (KITS) ×1 IMPLANT
KIT HEART LEFT (KITS) ×2 IMPLANT
PACK CARDIAC CATHETERIZATION (CUSTOM PROCEDURE TRAY) ×2 IMPLANT
STENT RESOLUTE ONYX 3.0X22 (Permanent Stent) ×1 IMPLANT
TRANSDUCER W/STOPCOCK (MISCELLANEOUS) ×2 IMPLANT
TUBING CIL FLEX 10 FLL-RA (TUBING) ×2 IMPLANT
WIRE HI TORQ WHISPER MS 190CM (WIRE) ×1 IMPLANT

## 2019-06-15 NOTE — Evaluation (Signed)
Physical Therapy Evaluation/ Discharge Patient Details Name: Joseph Jenkins MRN: [id number] DOB: 1950/12/30 Today's Date: 06/15/2019   History of Present Illness  69 yo admitted with SOB/CP cath planned 4/23. PMhx: HLD  Clinical Impression  Pt very pleasant eager to walk, brush teeth and be OOB. 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 Pt reports a few days of SOB with limited activity from his baseline. Pt currently able to perform all basic mobility and gait without assist. Pt noted to have HR increase to brief jumps to 116 and even 138 with limited gait without symptoms. HR quickly drops to <74 with standing rest and no SOB throughout. Pt educated for role and progression of gait with nursing supervision post cath. No further therapy needs with pt aware and agreeable.     Follow Up Recommendations No PT follow up    Equipment Recommendations  None recommended by PT    Recommendations for Other Services       Precautions / Restrictions Precautions Precautions: None      Mobility  Bed Mobility Overal bed mobility: Independent                Transfers Overall transfer level: Independent                  Ambulation/Gait Ambulation/Gait assistance: Independent Gait Distance (Feet): 800 Feet Assistive device: None Gait Pattern/deviations: WFL(Within Functional Limits)   Gait velocity interpretation: >4.37 ft/sec, indicative of normal walking speed    Stairs            Wheelchair Mobility    Modified Rankin (Stroke Patients Only)       Balance Overall balance assessment: Independent                                           Pertinent Vitals/Pain Pain Assessment: No/denies pain    Home Living Family/patient expects to be discharged to:: Private residence Living Arrangements: Spouse/significant other Available Help at Discharge: Family;Available 24  hours/day Type of Home: House         Home Equipment: None      Prior Function Level of Independence: Independent               Hand Dominance        Extremity/Trunk Assessment   Upper Extremity Assessment Upper Extremity Assessment: Overall WFL for tasks assessed    Lower Extremity Assessment Lower Extremity Assessment: Overall WFL for tasks assessed    Cervical / Trunk Assessment Cervical / Trunk Assessment: Normal  Communication   Communication: No difficulties  Cognition Arousal/Alertness: Awake/alert Behavior During Therapy: WFL for tasks assessed/performed Overall Cognitive Status: Within Functional Limits for tasks assessed                                        General Comments      Exercises     Assessment/Plan    PT Assessment Patent does not need any further PT services  PT Problem List         PT Treatment Interventions      PT Goals (Current goals can be found in the Care Plan section)  Acute Rehab PT Goals PT Goal Formulation: All assessment and education  complete, DC therapy    Frequency     Barriers to discharge        Co-evaluation               AM-PAC PT "6 Clicks" Mobility  Outcome Measure Help needed turning from your back to your side while in a flat bed without using bedrails?: None Help needed moving from lying on your back to sitting on the side of a flat bed without using bedrails?: None Help needed moving to and from a bed to a chair (including a wheelchair)?: None Help needed standing up from a chair using your arms (e.g., wheelchair or bedside chair)?: None Help needed to walk in hospital room?: None Help needed climbing 3-5 steps with a railing? : None 6 Click Score: 24    End of Session   Activity Tolerance: Patient tolerated treatment well Patient left: in chair;with call bell/phone within reach;with family/visitor present Nurse Communication: Mobility status PT Visit Diagnosis:  Other abnormalities of gait and mobility (R26.89)    Time: 1020-1045 PT Time Calculation (min) (ACUTE ONLY): 25 min   Charges:   PT Evaluation $PT Eval Moderate Complexity: 1 Mod           P, PT Acute Rehabilitation Services Pager: [phone] Office: [phone]   Enedina Finner  06/15/2019, 12:40 PM

## 2019-06-15 NOTE — Progress Notes (Addendum)
Progress Note  Patient Name: Joseph Jenkins Date of Encounter: 06/15/2019  Primary Cardiologist: New patient/Dr Delton See  Subjective   No further chest pain overnight  Inpatient Medications    Scheduled Meds: . amLODipine  5 mg Oral Daily  . aspirin EC  81 mg Oral Daily  . atorvastatin  80 mg Oral Daily  . sodium chloride flush  3 mL Intravenous Q12H   Continuous Infusions: . sodium chloride    . sodium chloride 1 mL/kg/hr (06/15/19 0513)  . heparin 1,100 Units/hr (06/15/19 0800)   PRN Meds: sodium chloride, sodium chloride flush   Vital Signs    Vitals:   06/14/19 2035 06/15/19 0002 06/15/19 0400 06/15/19 0736  BP: (!) 147/82 (!) 146/83 128/82   Pulse: (!) 55 (!) 55 (!) 53   Resp: 14 17 12    Temp: 98.1 F (36.7 C) 98 F (36.7 C) 97.8 F (36.6 C) 97.8 F (36.6 C)  TempSrc: Oral Oral Oral Oral  SpO2: 100% 98% 99%   Weight: 90.5 kg  90.4 kg   Height: 5\' 10"  (1.778 m)       Intake/Output Summary (Last 24 hours) at 06/15/2019 0912 Last data filed at 06/15/2019 0800 Gross per 24 hour  Intake 224.37 ml  Output 1375 ml  Net -1150.63 ml   Last 3 Weights 06/15/2019 06/14/2019 06/14/2019  Weight (lbs) 199 lb 4.7 oz 199 lb 8 oz 201 lb 8 oz  Weight (kg) 90.4 kg 90.493 kg 91.4 kg     Telemetry    SB - Personally Reviewed  ECG    SR, 1.AVB, non-specific ST T Wave abnormalities - Personally Reviewed  Physical Exam  GEN: No acute distress.   Neck: No JVD Cardiac: RRR, no murmurs, rubs, or gallops.  Respiratory: Clear to auscultation bilaterally. GI: Soft, nontender, non-distended  MS: No edema; No deformity. Neuro:  Nonfocal  Psych: Normal affect   Labs    High Sensitivity Troponin:   Recent Labs  Lab 06/14/19 1359 06/14/19 1630 06/14/19 1936 06/14/19 2057 06/14/19 2321  TROPONINIHS 22* 27* 22* 26* 25*      Chemistry Recent Labs  Lab 06/14/19 1359 06/15/19 0243  NA 138 139  K 4.2 4.3  CL 107 107  CO2 22 24  GLUCOSE 94 100*  BUN 17 16    CREATININE 1.03 1.12  CALCIUM 9.9 10.0  GFRNONAA >60 >60  GFRAA >60 >60  ANIONGAP 9 8     Hematology Recent Labs  Lab 06/14/19 1359 06/15/19 0243  WBC 7.3 7.4  RBC 5.25 5.18  HGB 15.7 15.5  HCT 48.2 46.4  MCV 91.8 89.6  MCH 29.9 29.9  MCHC 32.6 33.4  RDW 12.6 12.4  PLT 242 221    BNPNo results for input(s): BNP, PROBNP in the last 168 hours.   DDimer No results for input(s): DDIMER in the last 168 hours.   Radiology    DG Chest 2 View  Result Date: 06/14/2019 CLINICAL DATA:  Chest pain and shortness of breath for 5-6 days. EXAM: CHEST - 2 VIEW COMPARISON:  None. FINDINGS: Lungs clear. Heart size normal. No pneumothorax or pleural effusion. No acute or focal bony abnormality. IMPRESSION: Negative chest. Electronically Signed   By: 06/17/19 M.D.   On: 06/14/2019 14:39    Cardiac Studies   Echo is pending  Patient Profile     69 y.o. male with a hx of hypertension, hyperlipidemia who is being seen today for the evaluation of chest pain at the  request of Dr. Tanya Walsh.  Assessment & Plan    Chest pain 1. Highly suspicious for unstable angina, risk factors include hypertension and hyperlipidemia that is untreated, we will plan for cardiac catheterization today.  Will start aspirin and high-dose atorvastatin.  No beta-blockers as he is hypotensive at baseline.  We increased amlodipine to 5 mg daily.  I have reviewed his echocardiogram and his LVEF is 60 to 65%, with grade 1 diastolic dysfunction and normal filling pressures, mild mitral and trivial aortic regurgitation.  Normal RV EF.  Hypertension -increase amlodipine to 5 mg daily  Hyperlipidemia -start atorvastatin 80 mg po QHS, LDL today 170   For questions or updates, please contact [facility] Please consult [email or web address] for contact info under     Signed, Shavonda Wiedman, MD  06/15/2019, 9:12 AM    

## 2019-06-15 NOTE — Progress Notes (Signed)
Back from the cath. Lab awake and alert. TR band to right radial intact, elevated with pillow. Pulse ox to right thumb. Instructed to avoid bending right wrist and notify at once for any signs of bleeding.

## 2019-06-15 NOTE — Plan of Care (Signed)

## 2019-06-15 NOTE — H&P (View-Only) (Signed)
Progress Note  Patient Name: Joseph Jenkins Date of Encounter: 06/15/2019  Primary Cardiologist: New patient/Dr Delton See  Subjective   No further chest pain overnight  Inpatient Medications    Scheduled Meds: . amLODipine  5 mg Oral Daily  . aspirin EC  81 mg Oral Daily  . atorvastatin  80 mg Oral Daily  . sodium chloride flush  3 mL Intravenous Q12H   Continuous Infusions: . sodium chloride    . sodium chloride 1 mL/kg/hr (06/15/19 0513)  . heparin 1,100 Units/hr (06/15/19 0800)   PRN Meds: sodium chloride, sodium chloride flush   Vital Signs    Vitals:   06/14/19 2035 06/15/19 0002 06/15/19 0400 06/15/19 0736  BP: (!) 147/82 (!) 146/83 128/82   Pulse: (!) 55 (!) 55 (!) 53   Resp: 14 17 12    Temp: 98.1 F (36.7 C) 98 F (36.7 C) 97.8 F (36.6 C) 97.8 F (36.6 C)  TempSrc: Oral Oral Oral Oral  SpO2: 100% 98% 99%   Weight: 90.5 kg  90.4 kg   Height: 5\' 10"  (1.778 m)       Intake/Output Summary (Last 24 hours) at 06/15/2019 0912 Last data filed at 06/15/2019 0800 Gross per 24 hour  Intake 224.37 ml  Output 1375 ml  Net -1150.63 ml   Last 3 Weights 06/15/2019 06/14/2019 06/14/2019  Weight (lbs) 199 lb 4.7 oz 199 lb 8 oz 201 lb 8 oz  Weight (kg) 90.4 kg 90.493 kg 91.4 kg     Telemetry    SB - Personally Reviewed  ECG    SR, 1.AVB, non-specific ST T Wave abnormalities - Personally Reviewed  Physical Exam  GEN: No acute distress.   Neck: No JVD Cardiac: RRR, no murmurs, rubs, or gallops.  Respiratory: Clear to auscultation bilaterally. GI: Soft, nontender, non-distended  MS: No edema; No deformity. Neuro:  Nonfocal  Psych: Normal affect   Labs    High Sensitivity Troponin:   Recent Labs  Lab 06/14/19 1359 06/14/19 1630 06/14/19 1936 06/14/19 2057 06/14/19 2321  TROPONINIHS 22* 27* 22* 26* 25*      Chemistry Recent Labs  Lab 06/14/19 1359 06/15/19 0243  NA 138 139  K 4.2 4.3  CL 107 107  CO2 22 24  GLUCOSE 94 100*  BUN 17 16    CREATININE 1.03 1.12  CALCIUM 9.9 10.0  GFRNONAA >60 >60  GFRAA >60 >60  ANIONGAP 9 8     Hematology Recent Labs  Lab 06/14/19 1359 06/15/19 0243  WBC 7.3 7.4  RBC 5.25 5.18  HGB 15.7 15.5  HCT 48.2 46.4  MCV 91.8 89.6  MCH 29.9 29.9  MCHC 32.6 33.4  RDW 12.6 12.4  PLT 242 221    BNPNo results for input(s): BNP, PROBNP in the last 168 hours.   DDimer No results for input(s): DDIMER in the last 168 hours.   Radiology    DG Chest 2 View  Result Date: 06/14/2019 CLINICAL DATA:  Chest pain and shortness of breath for 5-6 days. EXAM: CHEST - 2 VIEW COMPARISON:  None. FINDINGS: Lungs clear. Heart size normal. No pneumothorax or pleural effusion. No acute or focal bony abnormality. IMPRESSION: Negative chest. Electronically Signed   By: 06/17/19 M.D.   On: 06/14/2019 14:39    Cardiac Studies   Echo is pending  Patient Profile     69 y.o. male with a hx of hypertension, hyperlipidemia who is being seen today for the evaluation of chest pain at the  request of Dr. Carollee Leitz.  Assessment & Plan    Chest pain 1. Highly suspicious for unstable angina, risk factors include hypertension and hyperlipidemia that is untreated, we will plan for cardiac catheterization today.  Will start aspirin and high-dose atorvastatin.  No beta-blockers as he is hypotensive at baseline.  We increased amlodipine to 5 mg daily.  I have reviewed his echocardiogram and his LVEF is 60 to 65%, with grade 1 diastolic dysfunction and normal filling pressures, mild mitral and trivial aortic regurgitation.  Normal RV EF.  Hypertension -increase amlodipine to 5 mg daily  Hyperlipidemia -start atorvastatin 80 mg po QHS, LDL today 170   For questions or updates, please contact [facility] Please consult [email or web address] for contact info under     Signed, Ena Dawley, MD  06/15/2019, 9:12 AM

## 2019-06-15 NOTE — Progress Notes (Signed)
Family Medicine Teaching Service Daily Progress Note Intern Pager: [phone]  Patient name: Joseph Jenkins Medical record number: [id number] Date of birth: [birth year] Age: 69 y.o. Gender: male  Primary Care Provider: Barbie Banner, MD Consultants: Cardiology Code Status: Full  Pt Overview and Major Events to Date:  04/23-admitted  Assessment and Plan: Joseph Jenkins is a 69 y.o. male presenting with exertional chest pain/SOB ongoing x 1 week. PMH is significant for Hyperlipidemia not any antihypertensives.  Exertional Chest Pain/SOB c/f Unstable Angina: Vital signs stable overnight.  Troponin continues to be remain flat.  Continues to be on heparin drip.  Plan for heart cath today.  TSH, CK 90, CK-MB 1.9, HIV nonreactive.  ECG today shows sinus bradycardia with first-degree AV block.  Benign exam. -Cardiology following, appreciate recommendations -Plan for heart cath today -Follow-up echo -Continue heparin drip -No beta-blockers -Continue amlodipine 5 mg daily -Continue to atorvastatin 80 mg -Continue ASA 81 mg -Continuous cardiac monitoring -Vital signs per unit  HTN: Blood pressures overnight 128-147/82/96.  Reports no history of hypertension.   -Continue amlodipine 5 mg daily  HLD: Lipid panel on admission notable for cholesterol 236, HDL 52, LDL 170 and Triggs 69.  Patient was not taking any medication at home. -Continue atorvastatin 80 mg daily  FEN/GI: -NPO PPx:  -Heparin   Disposition: Home when medically clear  Subjective:  No acute events overnight.  Denies any chest pain, shortness of breath, nausea vomiting or abdominal pain.  Awaiting heart cath.  No concerns voiced  Objective: Temp:  [97.7 F (36.5 C)-98.1 F (36.7 C)] 97.8 F (36.6 C) (04/23 0400) Pulse Rate:  [48-99] 53 (04/23 0400) Resp:  [12-18] 12 (04/23 0400) BP: (128-194)/(79-100) 128/82 (04/23 0400) SpO2:  [87 %-100 %] 99 % (04/23 0400) Weight:  [90.4 kg-91.4 kg] 90.4 kg (04/23  0400) Physical Exam:  General: 69 year old male lying in bed in no apparent distress Cardiovascular: Regular rate and rhythm, sinus bradycardia, no murmurs or gallops appreciated.  Distal pulses present Respiratory: Clear to auscultation bilaterally, no wheezes or crackles appreciated.  No increased work of breathing Abdomen: Soft, nontender, nondistended.  Bowel sounds present. Extremities: Moving all extremities, no lower extremity edema.   Laboratory: Recent Labs  Lab 06/14/19 1359 06/15/19 0243  WBC 7.3 7.4  HGB 15.7 15.5  HCT 48.2 46.4  PLT 242 221   Recent Labs  Lab 06/14/19 1359 06/15/19 0243  NA 138 139  K 4.2 4.3  CL 107 107  CO2 22 24  BUN 17 16  CREATININE 1.03 1.12  CALCIUM 9.9 10.0  GLUCOSE 94 100*      DG Chest 2 View  Result Date: 06/14/2019 CLINICAL DATA:  Chest pain and shortness of breath for 5-6 days. EXAM: CHEST - 2 VIEW COMPARISON:  None. FINDINGS: Lungs clear. Heart size normal. No pneumothorax or pleural effusion. No acute or focal bony abnormality. IMPRESSION: Negative chest. Electronically Signed   By: Drusilla Kanner M.D.   On: 06/14/2019 14:39      Dana Allan, MD 06/15/2019, 6:33 AM PGY-1, [facility] Family Medicine FPTS Intern pager: [phone], text pages welcome

## 2019-06-15 NOTE — Progress Notes (Signed)
Transported to the cath lab by .

## 2019-06-15 NOTE — Interval H&P Note (Signed)
Cath Lab Visit (complete for each Cath Lab visit)  Clinical Evaluation Leading to the Procedure:   ACS: Yes.    Non-ACS:    Anginal Classification: CCS IV  Anti-ischemic medical therapy: Minimal Therapy (1 class of medications)  Non-Invasive Test Results: No non-invasive testing performed  Prior CABG: No previous CABG      History and Physical Interval Note:  06/15/2019 4:38 PM  Joseph Jenkins  has presented today for surgery, with the diagnosis of unstable angina.  The various methods of treatment have been discussed with the patient and family. After consideration of risks, benefits and other options for treatment, the patient has consented to  Procedure(s): LEFT HEART CATH AND CORONARY ANGIOGRAPHY (N/A) as a surgical intervention.  The patient's history has been reviewed, patient examined, no change in status, stable for surgery.  I have reviewed the patient's chart and labs.  Questions were answered to the patient's satisfaction.     Tonny Bollman

## 2019-06-15 NOTE — Progress Notes (Signed)
OT Cancellation Note  Patient Details Name: Joseph Jenkins MRN: [id number] DOB: 03-26-1950   Cancelled Treatment:    Reason Eval/Treat Not Completed: OT screened, no needs identified, will sign off(Per PT pt is completing mobility and BADL At independent level. No OT needs identified. Please re consult if needs change, OT Will sign off. Thank you for this consult.)  Dalphine Handing, MSOT, OTR/L Acute Rehabilitation Services [facility] Office Number: [phone] Pager: [phone]  Dalphine Handing 06/15/2019, 11:22 AM

## 2019-06-15 NOTE — Progress Notes (Signed)
ANTICOAGULATION CONSULT NOTE  Pharmacy Consult for Heparin Indication: chest pain/ACS  Allergies  Allergen Reactions  . Bee Venom     Patient Measurements: Height: 5\' 10"  (177.8 cm) Weight: 90.5 kg (199 lb 8 oz) IBW/kg (Calculated) : 73 Heparin Dosing Weight: 91.3  Vital Signs: Temp: 98 F (36.7 C) (04/23 0002) Temp Source: Oral (04/23 0002) BP: 146/83 (04/23 0002) Pulse Rate: 55 (04/23 0002)  Labs: Recent Labs    06/14/19 1359 06/14/19 1630 06/14/19 1822 06/14/19 1936 06/14/19 2057 06/14/19 2321 06/15/19 0243  HGB 15.7  --   --   --   --   --  15.5  HCT 48.2  --   --   --   --   --  46.4  PLT 242  --   --   --   --   --  221  HEPARINUNFRC  --   --   --   --   --   --  0.61  CREATININE 1.03  --   --   --   --   --   --   CKTOTAL  --   --  90  --   --   --   --   CKMB  --   --  1.9  --   --   --   --   TROPONINIHS 22*   < >  --  22* 26* 25*  --    < > = values in this interval not displayed.    Estimated Creatinine Clearance: 77.7 mL/min (by C-G formula based on SCr of 1.03 mg/dL).   Medical History: No past medical history on file.  Medications:  Scheduled:  . amLODipine  5 mg Oral Daily  . aspirin  81 mg Oral Pre-Cath  . aspirin EC  81 mg Oral Daily  . atorvastatin  80 mg Oral Daily  . sodium chloride flush  3 mL Intravenous Q12H    Assessment: Patient is a 69 yom that is being admitted for chest pain. Patient is planned for a cath tomorrow. Pharmacy has been asked to dose heparin at this time for chest pain/ACS.  4/23 AM update:  Initial heparin level therapeutic   Goal of Therapy:  Heparin level 0.3-0.7 units/ml Monitor platelets by anticoagulation protocol: Yes   Plan:  -Cont heparin at 1100 units/hr -Confirmatory heparin level at 1200 -Monitor for bleeding  08-07-1994, PharmD, BCPS Clinical Pharmacist Phone: [phone]

## 2019-06-15 NOTE — Progress Notes (Signed)
  Echocardiogram 2D Echocardiogram has been performed.  Joseph Jenkins 06/15/2019, 9:20 AM

## 2019-06-15 NOTE — Progress Notes (Addendum)
ANTICOAGULATION CONSULT NOTE  Pharmacy Consult for Heparin Indication: chest pain/ACS  Allergies  Allergen Reactions  . Bee Venom     Patient Measurements: Height: 5\' 10"  (177.8 cm) Weight: 90.4 kg (199 lb 4.7 oz) IBW/kg (Calculated) : 73 Heparin Dosing Weight: 91.3  Vital Signs: Temp: 97.6 F (36.4 C) (04/23 1208) Temp Source: Oral (04/23 1208) BP: 157/97 (04/23 0939) Pulse Rate: 53 (04/23 0400)  Labs: Recent Labs    06/14/19 1359 06/14/19 1630 06/14/19 1822 06/14/19 1936 06/14/19 2057 06/14/19 2321 06/15/19 0243 06/15/19 1158  HGB 15.7  --   --   --   --   --  15.5  --   HCT 48.2  --   --   --   --   --  46.4  --   PLT 242  --   --   --   --   --  221  --   HEPARINUNFRC  --   --   --   --   --   --  0.61 0.60  CREATININE 1.03  --   --   --   --   --  1.12  --   CKTOTAL  --   --  90  --   --   --   --   --   CKMB  --   --  1.9  --   --   --   --   --   TROPONINIHS 22*   < >  --  22* 26* 25*  --   --    < > = values in this interval not displayed.    Estimated Creatinine Clearance: 71.4 mL/min (by C-G formula based on SCr of 1.12 mg/dL).   Medical History: No past medical history on file.  Medications:  Scheduled:  . amLODipine  5 mg Oral Daily  . aspirin EC  81 mg Oral Daily  . atorvastatin  80 mg Oral Daily  . sodium chloride flush  3 mL Intravenous Q12H    Assessment: 31 yoM that is being admitted for chest pain and SOB on exertion with cath scheduled today. Pharmacy has been asked to dose heparin at this time for chest pain/ACS. Patient does not have a past medical history of heart disease and was just on ASA 81mg  daily PTA.  Current heparin level is therapeutic at 0.60 at 1100 units/hour.CBC stable. No reported bleeding.  Goal of Therapy:  Heparin level 0.3-0.7 units/ml Monitor platelets by anticoagulation protocol: Yes   Plan:  -Cont heparin at 1100 units/hr -Follow up heparin plan after heart cath. -Monitor for bleeding  73,  PharmD Candidate [facility] [employer] [facility] [employer]

## 2019-06-16 ENCOUNTER — Encounter (HOSPITAL_COMMUNITY): Payer: Self-pay | Admitting: Family Medicine

## 2019-06-16 ENCOUNTER — Other Ambulatory Visit: Payer: Self-pay

## 2019-06-16 DIAGNOSIS — I214 Non-ST elevation (NSTEMI) myocardial infarction: Secondary | ICD-10-CM

## 2019-06-16 DIAGNOSIS — R072 Precordial pain: Secondary | ICD-10-CM

## 2019-06-16 DIAGNOSIS — I200 Unstable angina: Secondary | ICD-10-CM

## 2019-06-16 DIAGNOSIS — Z955 Presence of coronary angioplasty implant and graft: Secondary | ICD-10-CM

## 2019-06-16 DIAGNOSIS — E7800 Pure hypercholesterolemia, unspecified: Secondary | ICD-10-CM

## 2019-06-16 LAB — BASIC METABOLIC PANEL
Anion gap: 6 (ref 5–15)
BUN: 12 mg/dL (ref 8–23)
CO2: 24 mmol/L (ref 22–32)
Calcium: 9.8 mg/dL (ref 8.9–10.3)
Chloride: 110 mmol/L (ref 98–111)
Creatinine, Ser: 0.87 mg/dL (ref 0.61–1.24)
GFR calc Af Amer: >60 mL/min (ref >60)
GFR calc non Af Amer: >60 mL/min (ref >60)
Glucose, Bld: 100 mg/dL — ABNORMAL HIGH (ref 70–99)
Potassium: 4.4 mmol/L (ref 3.5–5.1)
Sodium: 140 mmol/L (ref 135–145)

## 2019-06-16 LAB — CBC
HCT: 46.9 % (ref 39.0–52.0)
Hemoglobin: 15.7 g/dL (ref 13.0–17.0)
MCH: 30 pg (ref 26.0–34.0)
MCHC: 33.5 g/dL (ref 30.0–36.0)
MCV: 89.5 fL (ref 80.0–100.0)
Platelets: 234 10*3/uL (ref 150–400)
RBC: 5.24 MIL/uL (ref 4.22–5.81)
RDW: 12.5 % (ref 11.5–15.5)
WBC: 8.5 10*3/uL (ref 4.0–10.5)
nRBC: 0 % (ref 0.0–0.2)

## 2019-06-16 LAB — MAGNESIUM: Magnesium: 2.1 mg/dL (ref 1.7–2.4)

## 2019-06-16 MED ORDER — AMLODIPINE BESYLATE 5 MG PO TABS: 5.0000 mg | ORAL_TABLET | Freq: Every day | ORAL | 0 refills | Status: DC

## 2019-06-16 MED ORDER — TICAGRELOR 90 MG PO TABS: 90.0000 mg | ORAL_TABLET | Freq: Two times a day (BID) | ORAL | 0 refills | Status: DC

## 2019-06-16 MED ORDER — ATORVASTATIN CALCIUM 80 MG PO TABS: 80.0000 mg | ORAL_TABLET | Freq: Every day | ORAL | 0 refills | Status: DC

## 2019-06-16 NOTE — Discharge Summary (Signed)
Family Medicine [facility] Discharge Summary  Patient name: Joseph Jenkins Medical record number: [id number] Date of birth: 1950-12-14 Age: 69 y.o. Gender: male Date of Admission: 06/14/2019  Date of Discharge: 06/16/2019 Admitting Physician: Dana Allan, MD  Primary Care Provider: Barbie Banner, MD Consultants: Cardiology  Indication for Hospitalization: new onset SOB on exertion  Discharge Diagnoses/Problem List:  Unstable Angina HTN HLD  Disposition: Home  Discharge Condition: Stable  Discharge Exam: 06/16/2019  General: Alert and oriented, no apparent distress Cardiovascular: RRR with no murmurs noted Respiratory: CTA bilaterally  Gastrointestinal: Bowel sounds present. No abdominal pain MSK: Upper extremity strength 5/5 bilaterally, Lower extremity strength 5/5 bilaterally    Brief Hospital Course:  Joseph Jenkins a 69 y.o.malepresenting with exertional chest pain/SOBongoing x 1 week. PMH is significant forHyperlipidemianot any antihypertensives.  His hospital course is outlined below.  Unstable angina Patient initially admitted for 1 week of worsening exertional chest pain and shortness of breath.  Admission details can be found in H&P.  EKG with sinus bradycardia without ST elevation or depression, troponin initially elevated to 22.  Cardiology was consulted, recommended admission for cardiac catheterization, started aspirin and high-dose atorvastatin as well.  Echo performed that showed EF 60 to 65% with grade 1 diastolic dysfunction, mild mitral and trivial aortic regurgitation, normal RV EF.  Cardiac catheterization was performed on 4/23, showed severe proximal to mid LAD stenosis, treated with PCI, mild nonobstructive left main, left circumflex, RCA stenosis.  Recommended dual antiplatelet therapy with aspirin and ticagrelor for 12 months.  At the time of discharge, patient was without complaint and had stable vital signs.  Patient was hypertensive  during his hospitalization, given the patient had significant underlying bradycardia, he was started on amlodipine 5 mg daily.  His blood pressure was stable at the time of discharge.   Issues for Follow Up:  1. Follow up with Cardiology and Cardiac Rehab 2. Monitor HTN prn 3. Medications initiated: Amlodipine, Atorvastatin, Ticagrelor, ASA 4. Patient advised to be on dual antiplatelet therapy for 12 months given PCI.  After 12 months, switch to aspirin 81 mg daily alone. 5. Avoid beta-blocker inpatient given his marked bradycardia. 6. Repeat lipid panel in 3 months, should be on atorvastatin 80 mg daily for 12 months.  LDL target less than 70, add Zetia if needed.   Significant Procedures: Left Heart Cath 06/15/2019 with PCI to LAD  Significant Labs and Imaging:  Recent Labs  Lab 06/14/19 1359 06/15/19 0243 06/16/19 0227  WBC 7.3 7.4 8.5  HGB 15.7 15.5 15.7  HCT 48.2 46.4 46.9  PLT 242 221 234   Recent Labs  Lab 06/14/19 1359 06/14/19 1359 06/14/19 1641 06/15/19 0243 06/16/19 0227  NA 138  --   --  139 140  K 4.2   < >  --  4.3 4.4  CL 107  --   --  107 110  CO2 22  --   --  24 24  GLUCOSE 94  --   --  100* 100*  BUN 17  --   --  16 12  CREATININE 1.03  --   --  1.12 0.87  CALCIUM 9.9  --   --  10.0 9.8  MG  --   --  2.1  --  2.1   < > = values in this interval not displayed.   DG Chest 2 View  Result Date: 06/14/2019 CLINICAL DATA:  Chest pain and shortness of breath for 5-6 days. EXAM: CHEST -  2 VIEW COMPARISON:  None. FINDINGS: Lungs clear. Heart size normal. No pneumothorax or pleural effusion. No acute or focal bony abnormality. IMPRESSION: Negative chest. Electronically Signed   By: Drusilla Kanner M.D.   On: 06/14/2019 14:39   CARDIAC CATHETERIZATION  Result Date: 06/15/2019 1.  Severe proximal to mid LAD stenosis, treated successfully with PCI using a 3.0 x 22 mm resolute Onyx DES 2.  Mild nonobstructive left main, left circumflex, and RCA stenosis 3.   Normal LVEDP 4.  Normal LV function by echo assessment Recommendations: Dual antiplatelet therapy with aspirin and ticagrelor x12 months.  Aggressive risk reduction measures.  Should be stable for hospital discharge tomorrow morning unless any problems arise.  ECHOCARDIOGRAM COMPLETE  Result Date: 06/15/2019    ECHOCARDIOGRAM REPORT   Patient Name:   Joseph Jenkins Date of Exam: 06/15/2019 Medical Rec #:  [id number]      Height:       70.0 in Accession #:    [id number]     Weight:       199.3 lb Date of Birth:  1950/09/25      BSA:          2.084 m Patient Age:    69 years       BP:           128/87 mmHg Patient Gender: M              HR:           52 bpm. Exam Location:  Inpatient Procedure: 2D Echo, Cardiac Doppler and Color Doppler Indications:    R07.9* Chest pain, unspecified  History:        Patient has no prior history of Echocardiogram examinations.                 Abnormal ECG, Signs/Symptoms:Chest Pain; Risk                 Factors:Hypertension and Dyslipidemia. Bradycardia.  Sonographer:    Sheralyn Boatman RDCS Referring Phys: [id number] Faustino Congress NELSON IMPRESSIONS  1. Distal septal and inferior basal hypokinesis . Left ventricular ejection fraction, by estimation, is 50 to 55%. The left ventricle has low normal function. The left ventricle demonstrates regional wall motion abnormalities (see scoring diagram/findings for description). There is mild left ventricular hypertrophy. Left ventricular diastolic parameters were normal.  2. Right ventricular systolic function is normal. The right ventricular size is normal.  3. The mitral valve is normal in structure. Trivial mitral valve regurgitation. No evidence of mitral stenosis.  4. The aortic valve is tricuspid. Aortic valve regurgitation is mild. No aortic stenosis is present.  5. The inferior vena cava is normal in size with greater than 50% respiratory variability, suggesting right atrial pressure of 3 mmHg. FINDINGS  Left Ventricle: Distal septal and  inferior basal hypokinesis. Left ventricular ejection fraction, by estimation, is 50 to 55%. The left ventricle has low normal function. The left ventricle demonstrates regional wall motion abnormalities. The left ventricular internal cavity size was normal in size. There is mild left ventricular hypertrophy. Left ventricular diastolic parameters were normal. Right Ventricle: The right ventricular size is normal. No increase in right ventricular wall thickness. Right ventricular systolic function is normal. Left Atrium: Left atrial size was normal in size. Right Atrium: Right atrial size was normal in size. Pericardium: There is no evidence of pericardial effusion. Mitral Valve: The mitral valve is normal in structure. There is mild thickening of the mitral valve leaflet(s). Normal mobility  of the mitral valve leaflets. Trivial mitral valve regurgitation. No evidence of mitral valve stenosis. Tricuspid Valve: The tricuspid valve is normal in structure. Tricuspid valve regurgitation is mild . No evidence of tricuspid stenosis. Aortic Valve: The aortic valve is tricuspid. Aortic valve regurgitation is mild. No aortic stenosis is present. Pulmonic Valve: The pulmonic valve was normal in structure. Pulmonic valve regurgitation is not visualized. No evidence of pulmonic stenosis. Aorta: The aortic root is normal in size and structure. Venous: The inferior vena cava is normal in size with greater than 50% respiratory variability, suggesting right atrial pressure of 3 mmHg. IAS/Shunts: No atrial level shunt detected by color flow Doppler.  LEFT VENTRICLE PLAX 2D LVIDd:         4.36 cm      Diastology LVIDs:         3.09 cm      LV e' lateral:   8.59 cm/s LV PW:         1.36 cm      LV E/e' lateral: 6.5 LV IVS:        1.20 cm      LV e' medial:    7.83 cm/s LVOT diam:     1.90 cm      LV E/e' medial:  7.2 LV SV:         67 LV SV Index:   32 LVOT Area:     2.84 cm  LV Volumes (MOD) LV vol d, MOD A2C: 123.0 ml LV vol d, MOD  A4C: 108.0 ml LV vol s, MOD A2C: 58.1 ml LV vol s, MOD A4C: 44.2 ml LV SV MOD A2C:     64.9 ml LV SV MOD A4C:     108.0 ml LV SV MOD BP:      65.8 ml RIGHT VENTRICLE             IVC RV S prime:     10.30 cm/s  IVC diam: 2.29 cm TAPSE (M-mode): 2.2 cm LEFT ATRIUM             Index       RIGHT ATRIUM           Index LA diam:        3.70 cm 1.78 cm/m  RA Area:     14.00 cm LA Vol (A2C):   46.3 ml 22.21 ml/m RA Volume:   32.20 ml  15.45 ml/m LA Vol (A4C):   34.5 ml 16.55 ml/m LA Biplane Vol: 42.0 ml 20.15 ml/m  AORTIC VALVE LVOT Vmax:   104.00 cm/s LVOT Vmean:  65.100 cm/s LVOT VTI:    0.235 m  AORTA Ao Root diam: 3.90 cm Ao Asc diam:  3.60 cm MITRAL VALVE MV Area (PHT): 2.62 cm    SHUNTS MV Decel Time: 290 msec    Systemic VTI:  0.24 m MV E velocity: 56.00 cm/s  Systemic Diam: 1.90 cm MV A velocity: 55.10 cm/s MV E/A ratio:  1.02 Charlton Haws MD Electronically signed by Charlton Haws MD Signature Date/Time: 06/15/2019/9:38:17 AM    Final     Results/Tests Pending at Time of Discharge:  No results found. Discharge Medications:  Allergies as of 06/16/2019      Reactions   Bee Venom       Medication List    TAKE these medications   amLODipine 5 MG tablet Commonly known as: NORVASC Take 1 tablet (5 mg total) by mouth daily. Start taking on: June 17, 2019 Notes  to patient: 06/17/19 Morning   aspirin EC 81 MG tablet Take 81 mg by mouth daily. Notes to patient: 06/17/19 Morning   atorvastatin 80 MG tablet Commonly known as: LIPITOR Take 1 tablet (80 mg total) by mouth daily. Start taking on: June 17, 2019 Notes to patient: 06/17/19 Morning   PROBIOTIC ACIDOPHILUS PO Take 1 tablet by mouth.   ticagrelor 90 MG Tabs tablet Commonly known as: BRILINTA Take 1 tablet (90 mg total) by mouth 2 (two) times daily. Notes to patient: 06/16/19 6 pm       Discharge Instructions: Please refer to Patient Instructions section of EMR for full details.  Patient was counseled important signs and  symptoms that should prompt return to medical care, changes in medications, dietary instructions, activity restrictions, and follow up appointments.   Follow-Up Appointments: Follow-up Information    [place], Crista Luria, [place] Follow up on 07/02/2019.   Specialty: Cardiology Why: 8:15 AM Contact information: [place] [address] [phone]           Advised to follow-up with PCP, cardiology, cardiac rehab as outpatient  Cleophas Dunker, DO 06/16/2019, 8:24 PM PGY-1, [place]

## 2019-06-16 NOTE — Progress Notes (Signed)
Progress Note  Patient Name: Joseph Jenkins Date of Encounter: 06/16/2019  Primary Cardiologist: No primary care provider on file. New Meda Coffee)  Subjective   Asymptomatic.  No angina.  No problems at right radial access site.  Inpatient Medications    Scheduled Meds: . amLODipine  5 mg Oral Daily  . aspirin EC  81 mg Oral Daily  . atorvastatin  80 mg Oral Daily  . sodium chloride flush  3 mL Intravenous Q12H  . ticagrelor  90 mg Oral BID   Continuous Infusions: . sodium chloride     PRN Meds: sodium chloride, acetaminophen, ondansetron (ZOFRAN) IV, sodium chloride flush   Vital Signs    Vitals:   06/15/19 2300 06/16/19 0232 06/16/19 0500 06/16/19 0737  BP: (!) 146/91 (!) 149/98  (!) 146/84  Pulse: (!) 42 (!) 57    Resp: 14 19  19   Temp: 98.3 F (36.8 C) 98.1 F (36.7 C)  97.9 F (36.6 C)  TempSrc: Oral Oral  Oral  SpO2: 95% 99%  99%  Weight:   91 kg   Height:        Intake/Output Summary (Last 24 hours) at 06/16/2019 1103 Last data filed at 06/16/2019 0900 Gross per 24 hour  Intake 1676.47 ml  Output 1875 ml  Net -198.53 ml   Last 3 Weights 06/16/2019 06/15/2019 06/14/2019  Weight (lbs) 200 lb 9.9 oz 199 lb 4.7 oz 199 lb 8 oz  Weight (kg) 91 kg 90.4 kg 90.493 kg      Telemetry    Sinus bradycardia- Personally Reviewed  ECG    Sinus bradycardia first-degree AV block nonspecific diffuse T wave flattening- Personally Reviewed  Physical Exam  Looks comfortable.  Appears fit GEN: No acute distress.   Neck: No JVD Cardiac: RRR, no murmurs, rubs, or gallops.  Respiratory: Clear to auscultation bilaterally. GI: Soft, nontender, non-distended  MS: No edema; No deformity.  Small flat ecchymosis at right radial access site.  Excellent pulses. Neuro:  Nonfocal  Psych: Normal affect   Labs    High Sensitivity Troponin:   Recent Labs  Lab 06/14/19 1359 06/14/19 1630 06/14/19 1936 06/14/19 2057 06/14/19 2321  TROPONINIHS 22* 27* 22* 26* 25*       Chemistry Recent Labs  Lab 06/14/19 1359 06/15/19 0243 06/16/19 0227  NA 138 139 140  K 4.2 4.3 4.4  CL 107 107 110  CO2 22 24 24   GLUCOSE 94 100* 100*  BUN 17 16 12   CREATININE 1.03 1.12 0.87  CALCIUM 9.9 10.0 9.8  GFRNONAA >60 >60 >60  GFRAA >60 >60 >60  ANIONGAP 9 8 6      Hematology Recent Labs  Lab 06/14/19 1359 06/15/19 0243 06/16/19 0227  WBC 7.3 7.4 8.5  RBC 5.25 5.18 5.24  HGB 15.7 15.5 15.7  HCT 48.2 46.4 46.9  MCV 91.8 89.6 89.5  MCH 29.9 29.9 30.0  MCHC 32.6 33.4 33.5  RDW 12.6 12.4 12.5  PLT 242 221 234    BNPNo results for input(s): BNP, PROBNP in the last 168 hours.   DDimer No results for input(s): DDIMER in the last 168 hours.   Radiology    DG Chest 2 View  Result Date: 06/14/2019 CLINICAL DATA:  Chest pain and shortness of breath for 5-6 days. EXAM: CHEST - 2 VIEW COMPARISON:  None. FINDINGS: Lungs clear. Heart size normal. No pneumothorax or pleural effusion. No acute or focal bony abnormality. IMPRESSION: Negative chest. Electronically Signed   By: Inge Rise  M.D.   On: 06/14/2019 14:39   CARDIAC CATHETERIZATION  Result Date: 06/15/2019 1.  Severe proximal to mid LAD stenosis, treated successfully with PCI using a 3.0 x 22 mm resolute Onyx DES 2.  Mild nonobstructive left main, left circumflex, and RCA stenosis 3.  Normal LVEDP 4.  Normal LV function by echo assessment Recommendations: Dual antiplatelet therapy with aspirin and ticagrelor x12 months.  Aggressive risk reduction measures.  Should be stable for hospital discharge tomorrow morning unless any problems arise.  ECHOCARDIOGRAM COMPLETE  Result Date: 06/15/2019    ECHOCARDIOGRAM REPORT   Patient Name:   Joseph Jenkins Date of Exam: 06/15/2019 Medical Rec #:  [id number]      Height:       70.0 in Accession #:    [id number]     Weight:       199.3 lb Date of Birth:  [birth year]      BSA:          2.084 m Patient Age:    68 years       BP:           128/87 mmHg Patient Gender: M               HR:           52 bpm. Exam Location:  Inpatient Procedure: 2D Echo, Cardiac Doppler and Color Doppler Indications:    R07.9* Chest pain, unspecified  History:        Patient has no prior history of Echocardiogram examinations.                 Abnormal ECG, Signs/Symptoms:Chest Pain; Risk                 Factors:Hypertension and Dyslipidemia. Bradycardia.  Sonographer:    Sheralyn Boatman RDCS Referring Phys: [id number] Faustino Congress NELSON IMPRESSIONS  1. Distal septal and inferior basal hypokinesis . Left ventricular ejection fraction, by estimation, is 50 to 55%. The left ventricle has low normal function. The left ventricle demonstrates regional wall motion abnormalities (see scoring diagram/findings for description). There is mild left ventricular hypertrophy. Left ventricular diastolic parameters were normal.  2. Right ventricular systolic function is normal. The right ventricular size is normal.  3. The mitral valve is normal in structure. Trivial mitral valve regurgitation. No evidence of mitral stenosis.  4. The aortic valve is tricuspid. Aortic valve regurgitation is mild. No aortic stenosis is present.  5. The inferior vena cava is normal in size with greater than 50% respiratory variability, suggesting right atrial pressure of 3 mmHg. FINDINGS  Left Ventricle: Distal septal and inferior basal hypokinesis. Left ventricular ejection fraction, by estimation, is 50 to 55%. The left ventricle has low normal function. The left ventricle demonstrates regional wall motion abnormalities. The left ventricular internal cavity size was normal in size. There is mild left ventricular hypertrophy. Left ventricular diastolic parameters were normal. Right Ventricle: The right ventricular size is normal. No increase in right ventricular wall thickness. Right ventricular systolic function is normal. Left Atrium: Left atrial size was normal in size. Right Atrium: Right atrial size was normal in size. Pericardium: There is no  evidence of pericardial effusion. Mitral Valve: The mitral valve is normal in structure. There is mild thickening of the mitral valve leaflet(s). Normal mobility of the mitral valve leaflets. Trivial mitral valve regurgitation. No evidence of mitral valve stenosis. Tricuspid Valve: The tricuspid valve is normal in structure. Tricuspid valve regurgitation is mild . No evidence of  tricuspid stenosis. Aortic Valve: The aortic valve is tricuspid. Aortic valve regurgitation is mild. No aortic stenosis is present. Pulmonic Valve: The pulmonic valve was normal in structure. Pulmonic valve regurgitation is not visualized. No evidence of pulmonic stenosis. Aorta: The aortic root is normal in size and structure. Venous: The inferior vena cava is normal in size with greater than 50% respiratory variability, suggesting right atrial pressure of 3 mmHg. IAS/Shunts: No atrial level shunt detected by color flow Doppler.  LEFT VENTRICLE PLAX 2D LVIDd:         4.36 cm      Diastology LVIDs:         3.09 cm      LV e' lateral:   8.59 cm/s LV PW:         1.36 cm      LV E/e' lateral: 6.5 LV IVS:        1.20 cm      LV e' medial:    7.83 cm/s LVOT diam:     1.90 cm      LV E/e' medial:  7.2 LV SV:         67 LV SV Index:   32 LVOT Area:     2.84 cm  LV Volumes (MOD) LV vol d, MOD A2C: 123.0 ml LV vol d, MOD A4C: 108.0 ml LV vol s, MOD A2C: 58.1 ml LV vol s, MOD A4C: 44.2 ml LV SV MOD A2C:     64.9 ml LV SV MOD A4C:     108.0 ml LV SV MOD BP:      65.8 ml RIGHT VENTRICLE             IVC RV S prime:     10.30 cm/s  IVC diam: 2.29 cm TAPSE (M-mode): 2.2 cm LEFT ATRIUM             Index       RIGHT ATRIUM           Index LA diam:        3.70 cm 1.78 cm/m  RA Area:     14.00 cm LA Vol (A2C):   46.3 ml 22.21 ml/m RA Volume:   32.20 ml  15.45 ml/m LA Vol (A4C):   34.5 ml 16.55 ml/m LA Biplane Vol: 42.0 ml 20.15 ml/m  AORTIC VALVE LVOT Vmax:   104.00 cm/s LVOT Vmean:  65.100 cm/s LVOT VTI:    0.235 m  AORTA Ao Root diam: 3.90 cm Ao Asc  diam:  3.60 cm MITRAL VALVE MV Area (PHT): 2.62 cm    SHUNTS MV Decel Time: 290 msec    Systemic VTI:  0.24 m MV E velocity: 56.00 cm/s  Systemic Diam: 1.90 cm MV A velocity: 55.10 cm/s MV E/A ratio:  1.02 Charlton Haws MD Electronically signed by Charlton Haws MD Signature Date/Time: 06/15/2019/9:38:17 AM    Final     Cardiac Studies   See above  Patient Profile     69 y.o. male with severe hypercholesterolemia and hypertension presenting with unstable angina, now day 1 status post drug-eluting stent to proximal LAD  Assessment & Plan    Okay for discharge home.  We will make arrangements for early follow-up. Aspirin 81 mg and ticagrelor grams twice daily, after 73-month switch to aspirin 81 mg daily alone. No beta-blockers due to marked bradycardia. Atorvastatin 80 mg once daily for 12 months, but repeat lipid profile in 3 months.  He may need additional medication such as  ezetimibe to reach target LDL less than 70. Gradually return to regular physical exercise, prefer moderate aerobic activity.     For questions or updates, please contact [facility] Please consult [email or web address] for contact info under        Signed, Thurmon Fair, MD  06/16/2019, 11:03 AM

## 2019-06-16 NOTE — Progress Notes (Signed)
Removed PIV access x1 and received discharge instructions and understood it well. Pt's wife took all his belongings. HS [employer]

## 2019-06-16 NOTE — Discharge Instructions (Signed)
Thank you for allowing [place] to participate in your care during your hospitalization.  You were admitted for unstable angina which was treated with a Stent that was placed in one of the arteries of your heart to keep it open and allow blood to flow. To help this from clogging up you were started on a medication called Ticagrelor which you will continue to take twice a day for 12 months.    Your Medications will now be as follows: Ticagrelor (Brillinta) ASA Amlodipine (Norvasc) Atorvastatin (Lipitor)  Your cardiologist will call you to schedule a follow up appointment.  Should you have any worsening shortness of breath, chest pain, with or without activity, or excessive bleeding please seek medical attention at your nearest Urgent Care or Emergency Department    Angina  Angina is very bad discomfort or pain in the chest, neck, arm, jaw, or back. The discomfort is caused by a lack of blood in the middle layer of the heart wall (myocardium). What are the causes? This condition is caused by a buildup of fat and cholesterol (plaque) in your arteries (atherosclerosis). This buildup narrows the arteries and makes it hard for blood to flow. What increases the risk? You are more likely to develop this condition if:  You have high levels of cholesterol in your blood.  You have high blood pressure (hypertension).  You have diabetes.  You have a family history of heart disease.  You are not active, or you do not exercise enough.  You feel sad (depressed).  You have been treated with high energy rays (radiation) on the left side of your chest. Other risk factors are:  Using tobacco.  Being very overweight (obese).  Eating a diet high in unhealthy fats (saturated fats).  Having stress, or being exposed to things that cause stress.  Using drugs, such as cocaine. Women have a greater risk for angina if:  They are older than 50.  They have stopped having their period (are in  postmenopause). What are the signs or symptoms? Common symptoms of this condition in both men and women may include:  Chest pain, which may: ? Feel like a crushing or squeezing in the chest. ? Feel like a tightness, pressure, fullness, or heaviness in the chest. ? Last for more than a few minutes at a time. ? Stop and come back (recur) after a few minutes.  Pain in the neck, arm, jaw, or back.  Heartburn or upset stomach (indigestion) for no reason.  Being short of breath.  Feeling sick to your stomach (nauseous).  Sudden cold sweats. Women and people with diabetes may have other symptoms that are not usual, such as feeling:  Tired (fatigue).  Worried or nervous (anxious) for no reason.  Weak for no reason.  Dizzy or passing out (fainting). How is this treated? This condition may be treated with:  Medicines. These are given to: ? Prevent blood clots. ? Prevent heart attack. ? Relax blood vessels and improve blood flow to the heart (nitrates). ? Reduce blood pressure. ? Improve the pumping action of the heart. ? Reduce fat and cholesterol in the blood.  A procedure to widen a narrowed or blocked artery in the heart (angioplasty).  Surgery to allow blood to go around a blocked artery (coronary artery bypass surgery). Follow these instructions at home: Medicines  Take over-the-counter and prescription medicines only as told by your doctor.  Do not take these medicines unless your doctor says that you can: ? NSAIDs. These include:  Ibuprofen.  Naproxen. ? Vitamin supplements that have vitamin A, vitamin E, or both. ? Hormone therapy that contains estrogen with or without progestin. Eating and drinking   Eat a heart-healthy diet that includes: ? Lots of fresh fruits and vegetables. ? Whole grains. ? Low-fat (lean) protein. ? Low-fat dairy products.  Follow instructions from your doctor about what you cannot eat or drink. Activity  Follow an exercise  program that your doctor tells you.  Talk with your doctor about joining a program to help improve the health of your heart (cardiac rehab).  When you feel tired, take a break. Plan breaks if you know you are going to feel tired. Lifestyle   Do not use any products that contain nicotine or tobacco. This includes cigarettes, e-cigarettes, and chewing tobacco. If you need help quitting, ask your doctor.  If your doctor says you can drink alcohol: ? Limit how much you use to:  0-1 drink a day for women who are not pregnant.  0-2 drinks a day for men. ? Be aware of how much alcohol is in your drink. In the [place] one drink equals:  One 12 oz bottle of beer (355 mL).  One 5 oz glass of wine (148 mL).  One 1 oz glass of hard liquor (44 mL). General instructions  Stay at a healthy weight. If your doctor tells you to do so, work with him or her to lose weight.  Learn to deal with stress. If you need help, ask your doctor.  Keep your vaccines up to date. Get a flu shot every year.  Talk with your doctor if you feel sad. Take a screening test to see if you are at risk for depression.  Work with your doctor to manage any other health problems that you have. These may include diabetes or high blood pressure.  Keep all follow-up visits as told by your doctor. This is important. Get help right away if:  You have pain in your chest, neck, arm, jaw, or back, and the pain: ? Lasts more than a few minutes. ? Comes back. ? Does not get better after you take medicine under your tongue (sublingual nitroglycerin). ? Keeps getting worse. ? Comes more often.  You have any of these problems for no reason: ? Sweating a lot. ? Heartburn or upset stomach. ? Shortness of breath. ? Trouble breathing. ? Feeling sick to your stomach. ? Throwing up (vomiting). ? Feeling more tired than normal. ? Feeling nervous or worrying more than normal. ? Weakness.  You are suddenly dizzy or  light-headed.  You pass out. These symptoms may be an emergency. Do not wait to see if the symptoms will go away. Get medical help right away. Call your local emergency services (911 in the [place] Do not drive yourself to the hospital. Summary  Angina is very bad discomfort or pain in the chest, neck, arm, neck, or back.  Symptoms include chest pain, heartburn or upset stomach for no reason, and shortness of breath.  Women or people with diabetes may have symptoms that are not usual, such as feeling nervous or worried for no reason, weak for no reason, or tired.  Take all medicines only as told by your doctor.  You should eat a heart-healthy diet and follow an exercise program. This information is not intended to replace advice given to you by your health care provider. Make sure you discuss any questions you have with your health care provider. Document Revised: 09/26/2017 Document  Reviewed: 09/26/2017 Elsevier Patient Education  [employer].

## 2019-06-16 NOTE — Progress Notes (Signed)
CARDIAC REHAB PHASE Up in room    MODE:  Ambulation: 400 ft   POST:  Rate/Rhythm: 68 SR  BP:  Supine:   Sitting: 150/94  Standing:    SaO2: 99%RA 0850-0943 Pt walked 400 ft on RA with steady gait and no CP. Tolerated well. MI education completed with pt who voiced understanding. Stressed importance of brililnta with stent. Reviewed NTG use, MI restrictions, heart healthy food choices, walking for ex, stress and CRP 2. Referred to [place] program. Pt is interested in participating in Virtual Cardiac and Pulmonary Rehab. Pt advised that Virtual Cardiac and Pulmonary Rehab is provided at no cost to the patient.  Checklist:  1. Pt has smart device  ie smartphone and/or ipad for downloading an app  Yes 2. Reliable internet/wifi service    Yes 3. Understands how to use their smartphone and navigate within an app.  Yes   Pt verbalized understanding and is in agreement.    Luetta Nutting, RN BSN  06/16/2019 9:38 AM

## 2019-06-21 ENCOUNTER — Telehealth (HOSPITAL_COMMUNITY): Payer: Self-pay

## 2019-06-21 NOTE — Telephone Encounter (Signed)
Pt returned CR phone call and stated he is not interested in CR at this time. He is working out at [employer].  Closed referral

## 2019-06-21 NOTE — Telephone Encounter (Signed)
Attempted to call patient in regards to Cardiac Rehab - LM on VM 

## 2019-06-21 NOTE — Telephone Encounter (Signed)
Pt insurance is active and benefits verified through [facility]. Co-pay $45.00, DED $0.00/$0.00 met, out of pocket $5,000.00/$53.65 met, co-insurance 0%. No pre-authorization required. [employer], 06/21/19 @ 10:02AM, KOR#[id number]  Will contact patient to see if he is interested in the Cardiac Rehab Program. If interested, patient will need to complete follow up appt. Once completed, patient will be contacted for scheduling upon review by the RN Navigator.

## 2019-06-27 ENCOUNTER — Telehealth: Payer: Self-pay | Admitting: Cardiology

## 2019-06-27 NOTE — Telephone Encounter (Signed)
Patient is requesting to have his wife come with him to his appt with Chelsea Aus on 07/02/19. He states that she likes to hear what the doctor has to say and to help him remember.

## 2019-06-28 NOTE — Telephone Encounter (Signed)
Returned call to pt.  Per Chelsea Aus, PA-C and policy, pt is aware that we aren't allowing visitors at the moment, unless pt needs assistance. Per pt, he don't need assistance.  Pt was advised that he can call wife or face-time during the appt that way she can take part in the appt.  Pt understands, then stated, "just forget it , alright?" then hung up the phone. Not sure if he meant to cancel the appt or forget he asked.  Will leave appt on schedule.Marland Kitchen

## 2019-07-02 ENCOUNTER — Ambulatory Visit: Payer: 59 | Admitting: Physician Assistant

## 2019-07-02 ENCOUNTER — Other Ambulatory Visit: Payer: Self-pay

## 2019-07-02 ENCOUNTER — Encounter: Payer: Self-pay | Admitting: Physician Assistant

## 2019-07-02 VITALS — BP 122/62 | HR 59 | Ht 70.0 in | Wt 193.8 lb

## 2019-07-02 DIAGNOSIS — R0602 Shortness of breath: Secondary | ICD-10-CM

## 2019-07-02 DIAGNOSIS — I25110 Atherosclerotic heart disease of native coronary artery with unstable angina pectoris: Secondary | ICD-10-CM | POA: Diagnosis not present

## 2019-07-02 DIAGNOSIS — E785 Hyperlipidemia, unspecified: Secondary | ICD-10-CM

## 2019-07-02 DIAGNOSIS — I214 Non-ST elevation (NSTEMI) myocardial infarction: Secondary | ICD-10-CM

## 2019-07-02 DIAGNOSIS — I10 Essential (primary) hypertension: Secondary | ICD-10-CM | POA: Diagnosis not present

## 2019-07-02 MED ORDER — NITROGLYCERIN 0.4 MG SL SUBL: 0.4000 mg | SUBLINGUAL_TABLET | SUBLINGUAL | 3 refills | Status: DC | PRN

## 2019-07-02 NOTE — Progress Notes (Signed)
Cardiology Office Note    Date:  07/02/2019   ID:  Joseph Jenkins, DOB Jan 14, 1951, MRN [id number]  PCP:  Barbie Banner, MD  Cardiologist:  Dr. Delton See   Chief Complaint: Hospital follow up   History of Present Illness:   Nilan Iddings is a 69 y.o. male with no prior PMH except for HTN and untreated HLD most recently admitted to hospital 05/2019 with chest pain concerning for [place].  Echocardiogram showed LV function of 50 to 55%.  Cardiac catheterization showed severe proximal LAD stenosis s/p PCI with DES.  Otherwise, mild nonobstructive disease.  Recommended dual antiplatelet therapy with aspirin and Brilinta for 12 months.  No beta-blocker given bradycardia.  Placed on statin therapy.  Here today for follow-up. Walking 1-3 miles/day without any issue. Also does exercise. The patient denies nausea, vomiting, fever, chest pain, palpitations, shortness of breath, orthopnea, PND, dizziness, syncope, cough, congestion, abdominal pain, hematochezia, melena, lower extremity edema.  Past Medical History:  Diagnosis Date  . CAD (coronary artery disease), native coronary artery    S/p DES to mid LAD 06/15/2019  . Hyperlipidemia LDL goal <70   . Hypertension     Past Surgical History:  Procedure Laterality Date  . CORONARY STENT INTERVENTION N/A 06/15/2019   Procedure: CORONARY STENT INTERVENTION;  Surgeon: Tonny Bollman, MD;  Location: [facility] INVASIVE CV LAB;  Service: Cardiovascular;  Laterality: N/A;  . LEFT HEART CATH AND CORONARY ANGIOGRAPHY N/A 06/15/2019   Procedure: LEFT HEART CATH AND CORONARY ANGIOGRAPHY;  Surgeon: Tonny Bollman, MD;  Location: [facility] INVASIVE CV LAB;  Service: Cardiovascular;  Laterality: N/A;    Current Medications: Prior to Admission medications   Medication Sig Start Date End Date Taking? Authorizing Provider  amLODipine (NORVASC) 5 MG tablet Take 1 tablet (5 mg total) by mouth daily. 06/17/19   Dana Allan, MD  aspirin EC 81 MG tablet Take 81 mg by mouth daily.     [provider]  atorvastatin (LIPITOR) 80 MG tablet Take 1 tablet (80 mg total) by mouth daily. 06/17/19   Dana Allan, MD  Lactobacillus (PROBIOTIC ACIDOPHILUS PO) Take 1 tablet by mouth.    [provider]  ticagrelor (BRILINTA) 90 MG TABS tablet Take 1 tablet (90 mg total) by mouth 2 (two) times daily. 06/16/19   Dana Allan, MD    Allergies:   Bee venom   Social History   Socioeconomic History  . Marital status: Married    Spouse name: Not on file  . Number of children: Not on file  . Years of education: Not on file  . Highest education level: Not on file  Occupational History  . Not on file  Tobacco Use  . Smoking status: Never Smoker  . Smokeless tobacco: Never Used  Substance and Sexual Activity  . Alcohol use: Not Currently  . Drug use: Never  . Sexual activity: Not on file  Other Topics Concern  . Not on file  Social History Narrative  . Not on file   Social Determinants of Health   Financial Resource Strain:   . Difficulty of Paying Living Expenses:   Food Insecurity:   . Worried About Programme researcher, broadcasting/film/video in the Last Year:   . Barista in the Last Year:   Transportation Needs:   . Freight forwarder (Medical):   Marland Kitchen Lack of Transportation (Non-Medical):   Physical Activity:   . Days of Exercise per Week:   . Minutes of Exercise per Session:  Stress:   . Feeling of Stress :   Social Connections:   . Frequency of Communication with Friends and Family:   . Frequency of Social Gatherings with Friends and Family:   . Attends Religious Services:   . Active Member of Clubs or Organizations:   . Attends Banker Meetings:   Marland Kitchen Marital Status:      Family History:  The patient's family history includes Hyperlipidemia in his father; Hypertension in his father.   ROS:   Please see the history of present illness.    ROS All other systems reviewed and are negative.   PHYSICAL EXAM:   VS:  BP 122/62   Pulse (!) 59    Ht 5\' 10"  (1.778 m)   Wt 193 lb 12.8 oz (87.9 kg)   SpO2 97%   BMI 27.81 kg/m    GEN: Well nourished, well developed, in no acute distress  HEENT: normal  Neck: no JVD, carotid bruits, or masses Cardiac: RRR; no murmurs, rubs, or gallops,no edema  Respiratory:  clear to auscultation bilaterally, normal work of breathing GI: soft, nontender, nondistended, + BS MS: no deformity or atrophy  Skin: warm and dry, no rash Neuro:  Alert and Oriented x 3, Strength and sensation are intact Psych: euthymic mood, full affect  Wt Readings from Last 3 Encounters:  07/02/19 193 lb 12.8 oz (87.9 kg)  06/16/19 200 lb 9.9 oz (91 kg)      Studies/Labs Reviewed:   EKG:  EKG is ordered today.  The ekg ordered today demonstrates sinus bradycardia at rate of 59 bpm  Recent Labs: 06/14/2019: TSH 1.224 06/16/2019: BUN 12; Creatinine, Ser 0.87; Hemoglobin 15.7; Magnesium 2.1; Platelets 234; Potassium 4.4; Sodium 140   Lipid Panel    Component Value Date/Time   CHOL 236 (H) 06/14/2019 1641   TRIG 69 06/14/2019 1641   HDL 52 06/14/2019 1641   CHOLHDL 4.5 06/14/2019 1641   VLDL 14 06/14/2019 1641   LDLCALC 170 (H) 06/14/2019 1641    Additional studies/ records that were reviewed today include:   Echocardiogram: 06/15/19 1. Distal septal and inferior basal hypokinesis . Left ventricular  ejection fraction, by estimation, is 50 to 55%. The left ventricle has low  normal function. The left ventricle demonstrates regional wall motion  abnormalities (see scoring  diagram/findings for description). There is mild left ventricular  hypertrophy. Left ventricular diastolic parameters were normal.  2. Right ventricular systolic function is normal. The right ventricular  size is normal.  3. The mitral valve is normal in structure. Trivial mitral valve  regurgitation. No evidence of mitral stenosis.  4. The aortic valve is tricuspid. Aortic valve regurgitation is mild. No  aortic stenosis is present.    5. The inferior vena cava is normal in size with greater than 50%  respiratory variability, suggesting right atrial pressure of 3 mmHg.   06/17/19, MD (Primary)    Procedures  CORONARY STENT INTERVENTION  06/15/19  LEFT HEART CATH AND CORONARY ANGIOGRAPHY  Conclusion  1.  Severe proximal to mid LAD stenosis, treated successfully with PCI using a 3.0 x 22 mm resolute Onyx DES 2.  Mild nonobstructive left main, left circumflex, and RCA stenosis 3.  Normal LVEDP 4.  Normal LV function by echo assessment  Recommendations: Dual antiplatelet therapy with aspirin and ticagrelor x12 months.  Aggressive risk reduction measures.  Should be stable for hospital discharge tomorrow morning unless any problems arise.    ASSESSMENT & PLAN:  1. CAD s/p DES to proximal to mid LAD No angina.  Continue exercise program.  Continue aspirin, Brilinta and statin.  No beta-blocker due to resting heart rate in 50s.  Cost of Brilinta is $100/month.  He will give [place] call if need to switch to something else.  2.  Hyperlipidemia -LDL goal less than 70 06/14/2019: Cholesterol 236; HDL 52; LDL Cholesterol 170; Triglycerides 69; VLDL 14 -Continue high intensity statin -Repeat labs in 6 weeks  3.  Hypertension -Blood pressure stable on amlodipine  Medication Adjustments/Labs and Tests Ordered: Current medicines are reviewed at length with the patient today.  Concerns regarding medicines are outlined above.  Medication changes, Labs and Tests ordered today are listed in the Patient Instructions below. Patient Instructions  Medication Instructions:  Your physician has recommended you make the following change in your medication:  1.  START Nitroglycerin 0.4 s/l tablets.  TAKE ONLY AS NEEDED AS DIRECTED   *If you need a refill on your cardiac medications before your next appointment, please call your pharmacy*   Lab Work: 08/20/19:  COME TO THE OFFICE FOR FASTING LABS (nothing to eat or drink after  midnight the night before)  LIPID / LFT  If you have labs (blood work) drawn today and your tests are completely normal, you will receive your results only by: Marland Kitchen MyChart Message (if you have MyChart) OR . A paper copy in the mail If you have any lab test that is abnormal or we need to change your treatment, we will call you to review the results.   Testing/Procedures: None ordered   Follow-Up: At [facility], you and your health needs are our priority.  As part of our continuing mission to provide you with exceptional heart care, we have created designated Provider Care Teams.  These Care Teams include your primary Cardiologist (physician) and Advanced Practice Providers (APPs -  Physician Assistants and Nurse Practitioners) who all work together to provide you with the care you need, when you need it.  We recommend signing up for the patient portal called "MyChart".  Sign up information is provided on this After Visit Summary.  MyChart is used to connect with patients for Virtual Visits (Telemedicine).  Patients are able to view lab/test results, encounter notes, upcoming appointments, etc.  Non-urgent messages can be sent to your provider as well.   To learn more about what you can do with MyChart, go to [email or web address].    Your next appointment:   3-4 month(s)  10/31/19 ARRIVE AT 1:30  The format for your next appointment:   In Person  Provider:   You may see Ena Dawley, MD or one of the following Advanced Practice Providers on your designated Care Team:    Melina Copa, PA-C  Ermalinda Barrios, PA-C    Other Instructions      Signed, Leanor Kail, [place]  07/02/2019 [address], [place], [place]  [address] Phone: [phone]; Fax: [phone]

## 2019-07-02 NOTE — Patient Instructions (Addendum)
Medication Instructions:  Your physician has recommended you make the following change in your medication:  1.  START Nitroglycerin 0.4 s/l tablets.  TAKE ONLY AS NEEDED AS DIRECTED   *If you need a refill on your cardiac medications before your next appointment, please call your pharmacy*   Lab Work: 08/20/19:  COME TO THE OFFICE FOR FASTING LABS (nothing to eat or drink after midnight the night before)  LIPID / LFT  If you have labs (blood work) drawn today and your tests are completely normal, you will receive your results only by: Marland Kitchen MyChart Message (if you have MyChart) OR . A paper copy in the mail If you have any lab test that is abnormal or we need to change your treatment, we will call you to review the results.   Testing/Procedures: None ordered   Follow-Up: At [facility], you and your health needs are our priority.  As part of our continuing mission to provide you with exceptional heart care, we have created designated Provider Care Teams.  These Care Teams include your primary Cardiologist (physician) and Advanced Practice Providers (APPs -  Physician Assistants and Nurse Practitioners) who all work together to provide you with the care you need, when you need it.  We recommend signing up for the patient portal called "MyChart".  Sign up information is provided on this After Visit Summary.  MyChart is used to connect with patients for Virtual Visits (Telemedicine).  Patients are able to view lab/test results, encounter notes, upcoming appointments, etc.  Non-urgent messages can be sent to your provider as well.   To learn more about what you can do with MyChart, go to [email or web address].    Your next appointment:   3-4 month(s)  10/31/19 ARRIVE AT 1:30  The format for your next appointment:   In Person  Provider:   You may see Tobias Alexander, MD or one of the following Advanced Practice Providers on your designated Care Team:    Ronie Spies, PA-C  Jacolyn Reedy,  PA-C    Other Instructions

## 2019-07-06 ENCOUNTER — Telehealth: Payer: Self-pay | Admitting: Cardiology

## 2019-07-06 NOTE — Telephone Encounter (Signed)
Pt called to report that he was having a profuse nose bleed that had been about 20 minutes.. He says he was soaking through tissues and holding his back and it was "flowing" down his throat.   I advised him to keep his head in a more neutral position, hold pressure for several minutes, and apple ice if he can. I advised him that I will call him back to check on him... if not clotting off and improving he may need to go to the ED.   Addendum: I called the pt back 30 min later and he reports that it has much improved. He denies headache, dizziness, and was feeling well according to him.   I advised him to continue to apply the ice, can try saline nasal spray much later and to continue to monitor and let us know if he continues to have this problem.   Pt BP 124/77.   Will forward to Dr. Delton See for her review.

## 2019-07-06 NOTE — Telephone Encounter (Signed)
  Pt c/o medication issue:  1. Name of Medication: ticagrelor (BRILINTA) 90 MG TABS tablet  2. How are you currently taking this medication (dosage and times per day)? 1 tablet twice a day  3. Are you having a reaction (difficulty breathing--STAT)? no  4. What is your medication issue? Patient states his nose has been bleeding for 20 minutes and won't stop. He states he is also tired all the time.

## 2019-07-08 ENCOUNTER — Other Ambulatory Visit: Payer: Self-pay | Admitting: Family Medicine

## 2019-07-12 ENCOUNTER — Other Ambulatory Visit: Payer: Self-pay | Admitting: Physician Assistant

## 2019-07-12 MED ORDER — NITROGLYCERIN 0.4 MG SL SUBL: 0.4000 mg | SUBLINGUAL_TABLET | SUBLINGUAL | 6 refills | Status: DC | PRN

## 2019-07-12 NOTE — Telephone Encounter (Signed)
Pt's medications were sent to pt's pharmacy as requested. Confirmation received.  

## 2019-07-20 NOTE — Telephone Encounter (Signed)
Returned call to pt.  Advised Pt he could try nasal saline spray to prevent nosebleeds ( he didn't remember previously being advised to try this)  Advised if that did not help with nosebleeds he should call PCP for ENT referral.  Advised he could NOT miss any doses of his Brilinta.  Pt indicates understanding.  Will try nasal spray.

## 2019-07-20 NOTE — Telephone Encounter (Signed)
Pt c/o medication issue:  1. Name of Medication: ticagrelor (BRILINTA) 90 MG TABS tablet  2. How are you currently taking this medication (dosage and times per day)? As directed  3. Are you having a reaction (difficulty breathing--STAT)? No  4. What is your medication issue? Patient's wife is calling to follow up. She states patient has been experiencing nose bleeds and she assumes it it due to the medication. She states today, 07/20/19 around 12:30 am - 2:00 am the patient's nose was bleeding nonstop.  She states he has also been extremely fatigued. Please call.

## 2019-08-20 ENCOUNTER — Other Ambulatory Visit: Payer: Medicare HMO

## 2019-08-23 ENCOUNTER — Other Ambulatory Visit: Payer: Self-pay

## 2019-08-23 ENCOUNTER — Other Ambulatory Visit: Payer: Medicare HMO | Admitting: *Deleted

## 2019-08-23 DIAGNOSIS — I214 Non-ST elevation (NSTEMI) myocardial infarction: Secondary | ICD-10-CM

## 2019-08-23 DIAGNOSIS — E785 Hyperlipidemia, unspecified: Secondary | ICD-10-CM

## 2019-08-23 DIAGNOSIS — R0602 Shortness of breath: Secondary | ICD-10-CM

## 2019-08-23 LAB — HEPATIC FUNCTION PANEL
ALT: 23 IU/L (ref 0–44)
AST: 21 IU/L (ref 0–40)
Albumin: 4.3 g/dL (ref 3.8–4.8)
Alkaline Phosphatase: 131 IU/L — ABNORMAL HIGH (ref 48–121)
Bilirubin Total: 0.7 mg/dL (ref 0.0–1.2)
Bilirubin, Direct: 0.22 mg/dL (ref 0.00–0.40)
Total Protein: 6.3 g/dL (ref 6.0–8.5)

## 2019-08-23 LAB — LIPID PANEL
Chol/HDL Ratio: 2.9 ratio (ref 0.0–5.0)
Cholesterol, Total: 131 mg/dL (ref 100–199)
HDL: 45 mg/dL (ref >39)
LDL Chol Calc (NIH): 67 mg/dL (ref 0–99)
Triglycerides: 103 mg/dL (ref 0–149)
VLDL Cholesterol Cal: 19 mg/dL (ref 5–40)

## 2019-10-18 ENCOUNTER — Telehealth: Payer: Self-pay | Admitting: Cardiology

## 2019-10-18 MED ORDER — TICAGRELOR 90 MG PO TABS: ORAL_TABLET | ORAL | 0 refills | Status: DC

## 2019-10-18 NOTE — Telephone Encounter (Signed)
Pt's medication was sent to pt's pharmacy as requested for a 15 day supply, because pt is out of town and in need of medication. Confirmation received.

## 2019-10-18 NOTE — Telephone Encounter (Signed)
*  STAT* If patient is at the pharmacy, call can be transferred to refill team.   1. Which medications need to be refilled? (please list name of each medication and dose if known) BRILINTA 90 MG TABS tablet  2. Which pharmacy/location (including street and city if local pharmacy) is medication to be sent to? [facility] [address], [place] [phone]  3. Do they need a 30 day or 90 day supply? 14 day  Patient is out of the medication. He states he is in [place] and did not bring enough of the medication for his trip.

## 2019-10-31 ENCOUNTER — Ambulatory Visit: Payer: Medicare HMO | Admitting: Physician Assistant

## 2019-11-13 ENCOUNTER — Ambulatory Visit: Payer: Medicare HMO | Admitting: Physician Assistant

## 2019-11-13 ENCOUNTER — Encounter: Payer: Self-pay | Admitting: Physician Assistant

## 2019-11-13 ENCOUNTER — Other Ambulatory Visit: Payer: Self-pay

## 2019-11-13 VITALS — BP 128/82 | HR 53 | Ht 70.0 in | Wt 193.4 lb

## 2019-11-13 DIAGNOSIS — I10 Essential (primary) hypertension: Secondary | ICD-10-CM

## 2019-11-13 DIAGNOSIS — I25110 Atherosclerotic heart disease of native coronary artery with unstable angina pectoris: Secondary | ICD-10-CM | POA: Diagnosis not present

## 2019-11-13 DIAGNOSIS — E785 Hyperlipidemia, unspecified: Secondary | ICD-10-CM

## 2019-11-13 NOTE — Patient Instructions (Signed)
Medication Instructions:  Your physician recommends that you continue on your current medications as directed. Please refer to the Current Medication list given to you today.  *If you need a refill on your cardiac medications before your next appointment, please call your pharmacy*   Lab Work: None ordered  If you have labs (blood work) drawn today and your tests are completely normal, you will receive your results only by: . MyChart Message (if you have MyChart) OR . A paper copy in the mail If you have any lab test that is abnormal or we need to change your treatment, we will call you to review the results.   Testing/Procedures: None ordered   Follow-Up: At [facility], you and your health needs are our priority.  As part of our continuing mission to provide you with exceptional heart care, we have created designated Provider Care Teams.  These Care Teams include your primary Cardiologist (physician) and Advanced Practice Providers (APPs -  Physician Assistants and Nurse Practitioners) who all work together to provide you with the care you need, when you need it.  We recommend signing up for the patient portal called "MyChart".  Sign up information is provided on this After Visit Summary.  MyChart is used to connect with patients for Virtual Visits (Telemedicine).  Patients are able to view lab/test results, encounter notes, upcoming appointments, etc.  Non-urgent messages can be sent to your provider as well.   To learn more about what you can do with MyChart, go to [email or web address].    Your next appointment:   6 month(s)  The format for your next appointment:   In Person  Provider:   Katarina Nelson, MD   Other Instructions   

## 2019-11-13 NOTE — Progress Notes (Signed)
Cardiology Office Note    Date:  11/13/2019   ID:  Joseph Jenkins, DOB January 10, 1951, MRN [id number]  PCP:  Joseph Banner, MD  Cardiologist:  Dr. Delton See   Chief Complaint: 4 Months follow up  History of Present Illness:   Joseph Jenkins is a 69 y.o. male with hx of CAD s/p DES to mLAD 05/2019, HTN and HLD seen for follow up.   Admitted 05/2019 for [place]. Echocardiogram showed LV function of 50 to 55%.  Cardiac catheterization showed severe proximal LAD stenosis s/p PCI with DES.  Otherwise, mild nonobstructive disease.  Recommended dual antiplatelet therapy with aspirin and Brilinta for 12 months.  No beta-blocker given bradycardia.  Placed on statin therapy.  Here today for follow up.  No complaints.  He exercise 3 times per week without any limitation.  No chest pain, shortness of breath, orthopnea, PND, syncope, lower extremity edema or melena.  He had a lot of questions regarding stent and cholesterol.  All answered and reviewed in detail.      Past Medical History:  Diagnosis Date  . CAD (coronary artery disease), native coronary artery    S/p DES to mid LAD 06/15/2019  . Hyperlipidemia LDL goal <70   . Hypertension     Past Surgical History:  Procedure Laterality Date  . CORONARY STENT INTERVENTION N/A 06/15/2019   Procedure: CORONARY STENT INTERVENTION;  Surgeon: Tonny Bollman, MD;  [facility]: [facility]AS[facility] CV LAB;  Service: Cardiovascular;  Laterality: N/A;  . LEFT HEART CATH AND CORONARY ANGIOGRAPHY N/A 06/15/2019   Procedure: LEFT HEART CATH AND CORONARY ANGIOGRAPHY;  Surgeon: Tonny Bollman, MD;  Location: [facility] INVASIVE CV LAB;  Service: Cardiovascular;  Laterality: N/A;    Current Medications: Prior to Admission medications   Medication Sig Start Date End Date Taking? Authorizing Provider  amLODipine (NORVASC) 5 MG tablet TAKE 1 TABLET BY MOUTH EVERY DAY 07/12/19   Lars Masson, MD  aspirin EC 81 MG tablet Take 81 mg by mouth daily.    [provider]    atorvastatin (LIPITOR) 80 MG tablet TAKE 1 TABLET BY MOUTH EVERY DAY 07/12/19   Lars Masson, MD  Lactobacillus (PROBIOTIC ACIDOPHILUS PO) Take 1 tablet by mouth.    [provider]  nitroGLYCERIN (NITROSTAT) 0.4 MG SL tablet Place 1 tablet (0.4 mg total) under the tongue every 5 (five) minutes as needed. 07/12/19 10/10/19  Lars Masson, MD  ticagrelor (BRILINTA) 90 MG TABS tablet TAKE 1 TABLET (90 MG TOTAL) BY MOUTH 2 (TWO) TIMES DAILY. 10/18/19   Lars Masson, MD    Allergies:   Bee venom   Social History   Socioeconomic History  . Marital status: Married    Spouse name: Not on file  . Number of children: Not on file  . Years of education: Not on file  . Highest education level: Not on file  Occupational History  . Not on file  Tobacco Use  . Smoking status: Never Smoker  . Smokeless tobacco: Never Used  Vaping Use  . Vaping Use: Never used  Substance and Sexual Activity  . Alcohol use: Not Currently  . Drug use: Never  . Sexual activity: Not on file  Other Topics Concern  . Not on file  Social History Narrative  . Not on file   Social Determinants of Health   Financial Resource Strain:   . Difficulty of Paying Living Expenses: Not on file  Food Insecurity:   . Worried About Radiation protection practitioner  of Food in the Last Year: Not on file  . Ran Out of Food in the Last Year: Not on file  Transportation Needs:   . Lack of Transportation (Medical): Not on file  . Lack of Transportation (Non-Medical): Not on file  Physical Activity:   . Days of Exercise per Week: Not on file  . Minutes of Exercise per Session: Not on file  Stress:   . Feeling of Stress : Not on file  Social Connections:   . Frequency of Communication with Friends and Family: Not on file  . Frequency of Social Gatherings with Friends and Family: Not on file  . Attends Religious Services: Not on file  . Active Member of Clubs or Organizations: Not on file  . Attends Banker  Meetings: Not on file  . Marital Status: Not on file     Family History:  The patient's family history includes Hyperlipidemia in his father; Hypertension in his father.   ROS:   Please see the history of present illness.    ROS All other systems reviewed and are negative.   PHYSICAL EXAM:   VS:  BP 128/82   Pulse (!) 53   Ht 5\' 10"  (1.778 m)   Wt 193 lb 6.4 oz (87.7 kg)   SpO2 99%   BMI 27.75 kg/m    GEN: Well nourished, well developed, in no acute distress  HEENT: normal  Neck: no JVD, carotid bruits, or masses Cardiac:RRR; no murmurs, rubs, or gallops,no edema  Respiratory:  clear to auscultation bilaterally, normal work of breathing GI: soft, nontender, nondistended, + BS MS: no deformity or atrophy  Skin: warm and dry, no rash Neuro:  Alert and Oriented x 3, Strength and sensation are intact Psych: euthymic mood, full affect  Wt Readings from Last 3 Encounters:  11/13/19 193 lb 6.4 oz (87.7 kg)  07/02/19 193 lb 12.8 oz (87.9 kg)  06/16/19 200 lb 9.9 oz (91 kg)      Studies/Labs Reviewed:   EKG:  EKG is not ordered today.   Recent Labs: 06/14/2019: TSH 1.224 06/16/2019: BUN 12; Creatinine, Ser 0.87; Hemoglobin 15.7; Magnesium 2.1; Platelets 234; Potassium 4.4; Sodium 140 08/23/2019: ALT 23   Lipid Panel    Component Value Date/Time   CHOL 131 08/23/2019 0743   TRIG 103 08/23/2019 0743   HDL 45 08/23/2019 0743   CHOLHDL 2.9 08/23/2019 0743   CHOLHDL 4.5 06/14/2019 1641   VLDL 14 06/14/2019 1641   LDLCALC 67 08/23/2019 0743    Additional studies/ records that were reviewed today include:  Echocardiogram: 06/15/19 1. Distal septal and inferior basal hypokinesis . Left ventricular  ejection fraction, by estimation, is 50 to 55%. The left ventricle has low  normal function. The left ventricle demonstrates regional wall motion  abnormalities (see scoring  diagram/findings for description). There is mild left ventricular  hypertrophy. Left ventricular  diastolic parameters were normal.  2. Right ventricular systolic function is normal. The right ventricular  size is normal.  3. The mitral valve is normal in structure. Trivial mitral valve  regurgitation. No evidence of mitral stenosis.  4. The aortic valve is tricuspid. Aortic valve regurgitation is mild. No  aortic stenosis is present.  5. The inferior vena cava is normal in size with greater than 50%  respiratory variability, suggesting right atrial pressure of 3 mmHg.   06/17/19, MD (Primary)    Procedures  CORONARY STENT INTERVENTION  06/15/19  LEFT HEART CATH AND CORONARY ANGIOGRAPHY  Conclusion  1. Severe proximal to mid LAD stenosis, treated successfully with PCI using a 3.0 x 22 mm resolute Onyx DES 2. Mild nonobstructive left main, left circumflex, and RCA stenosis 3. Normal LVEDP 4. Normal LV function by echo assessment  Recommendations: Dual antiplatelet therapy with aspirin and ticagrelor x12 months. Aggressive risk reduction measures. Should be stable for hospital discharge tomorrow morning unless any problems arise.      ASSESSMENT & PLAN:    1. CAD s/p DES to LAD No angina. Continue ASA, Brillinta and statin.   2. HTN - BP stable on current medications.   3. HLD - 06/14/2019: VLDL 14 08/23/2019: Cholesterol, Total 131; HDL 45; LDL Chol Calc (NIH) 67; Triglycerides 103 - Continue statin    Medication Adjustments/Labs and Tests Ordered: Current medicines are reviewed at length with the patient today.  Concerns regarding medicines are outlined above.  Medication changes, Labs and Tests ordered today are listed in the Patient Instructions below. Patient Instructions  Medication Instructions:  Your physician recommends that you continue on your current medications as directed. Please refer to the Current Medication list given to you today.   *If you need a refill on your cardiac medications before your next appointment, please call your  pharmacy*   Lab Work: None ordered  If you have labs (blood work) drawn today and your tests are completely normal, you will receive your results only by: Marland Kitchen MyChart Message (if you have MyChart) OR . A paper copy in the mail If you have any lab test that is abnormal or we need to change your treatment, we will call you to review the results.   Testing/Procedures: None ordered   Follow-Up: At [facility], you and your health needs are our priority.  As part of our continuing mission to provide you with exceptional heart care, we have created designated Provider Care Teams.  These Care Teams include your primary Cardiologist (physician) and Advanced Practice Providers (APPs -  Physician Assistants and Nurse Practitioners) who all work together to provide you with the care you need, when you need it.  We recommend signing up for the patient portal called "MyChart".  Sign up information is provided on this After Visit Summary.  MyChart is used to connect with patients for Virtual Visits (Telemedicine).  Patients are able to view lab/test results, encounter notes, upcoming appointments, etc.  Non-urgent messages can be sent to your provider as well.   To learn more about what you can do with MyChart, go to [email or web address].    Your next appointment:   6 month(s)  The format for your next appointment:   In Person  Provider:   Tobias Alexander, MD   Other Instructions      Signed, Manson Passey, PA  11/13/2019 2:17 PM    [facility] [address] [place], [place], [place]  [address] Phone: [phone]; Fax: [phone]

## 2019-11-27 ENCOUNTER — Telehealth: Payer: Self-pay | Admitting: Cardiology

## 2019-11-27 NOTE — Telephone Encounter (Signed)
Patient is requesting to drop off a share reduction request form for assistance with purchasing Brilinta. He states the form is to be completed by Dr. Delton See and returned to [facility]. Please return call to discuss.

## 2019-11-27 NOTE — Telephone Encounter (Signed)
**Note De-Identified Devora Tortorella Obfuscation** The pt states that his son works in [employer] field and advised him that we can do a tier exception to try to lower the cost of his Brilinta and gave him a Community education officer tier exception form for to bring to [place].  I advised the pt that I can do a tier exception through covermymeds on his Brilinta without him having to bring me the form and he is requesting that I do that.  We briefly discussed switching to a less costing medication and pt asst through [place] and [place] Pt asst [employer] just incase the pt is denied the tier exception.

## 2019-11-27 NOTE — Telephone Encounter (Signed)
**Note De-Identified Joseph Jenkins Obfuscation** I started a Brilinta tier exception through covermymeds: Key: [id number]

## 2019-11-27 NOTE — Telephone Encounter (Signed)
Will forward this to our Prior Auth Nurse who further assist patients with these applications.

## 2019-11-28 NOTE — Telephone Encounter (Signed)
**Note De-Identified Cayetano Mikita Obfuscation** Letter received from [facility] stating that they have approved the pts tier exception for his Brilinta.  I called the pt but got no answer so I left him a detailed message on his VM (Ok per our conversation yesterday) advising him of the approval and advised him to call Larita Fife back at [facility] at [phone] if he has any questions.

## 2020-03-18 ENCOUNTER — Other Ambulatory Visit: Payer: Self-pay | Admitting: Cardiology

## 2020-03-18 MED ORDER — TICAGRELOR 90 MG PO TABS: ORAL_TABLET | ORAL | 2 refills | Status: DC

## 2020-05-10 NOTE — Progress Notes (Signed)
Cardiology Office Note:    Date:  05/12/2020   ID:  Joseph Jenkins, DOB Aug 16, 1950, MRN [id number]  PCP:  Barbie Banner, MD   [facility]  Cardiologist:  Tobias Alexander, MD  Advanced Practice Provider:  No care team member to display Electrophysiologist:  None   Referring MD: Barbie Banner, MD    History of Present Illness:    Joseph Jenkins is a 70 y.o. male with a hx of CAD s/p DES to mLAD 05/2019, HTN and HLD seen for follow up.   Patient admitted 05/2019 for unstable angina. TTE showed LV function of 50 to 55%. Cardiac catheterization showed severe proximal LAD stenosis s/p PCI with DES. Otherwise, mild nonobstructive disease. Recommended dual antiplatelet therapy with aspirin and Brilinta for 12 months. No beta-blocker given bradycardia. Placed on statin therapy.  Last seen in clinic by [facility] on 11/13/19 where he was doing well with no exertional symptoms.  The patient states that he has been having severe muscle aches on the atorvastatin 80mg  and he has since cut it in half to 40mg  daily with some improvement. No chest pain, SOB, orthopnea, PND, or LE edema. Continues to exercise daily without symptoms. Blood pressure well controlled at home. Maintaining a healthy diet. No orthopnea, PND, LE edema, or palpitations.  Past Medical History:  Diagnosis Date  . CAD (coronary artery disease), native coronary artery    S/p DES to mid LAD 06/15/2019  . Hyperlipidemia LDL goal <70   . Hypertension     Past Surgical History:  Procedure Laterality Date  . CORONARY STENT INTERVENTION N/A 06/15/2019   Procedure: CORONARY STENT INTERVENTION;  Surgeon: 06/17/2019, MD;  Location: [facility] INVASIVE CV LAB;  Service: Cardiovascular;  Laterality: N/A;  . LEFT HEART CATH AND CORONARY ANGIOGRAPHY N/A 06/15/2019   Procedure: LEFT HEART CATH AND CORONARY ANGIOGRAPHY;  Surgeon: [facility], MD;  Location: [facility] INVASIVE CV LAB;  Service: Cardiovascular;  Laterality:  N/A;    Current Medications: Current Meds  Medication Sig  . aspirin EC 81 MG tablet Take 81 mg by mouth daily.  . Lactobacillus (PROBIOTIC ACIDOPHILUS PO) Take 1 tablet by mouth.  . rosuvastatin (CRESTOR) 20 MG tablet Take 1 tablet (20 mg total) by mouth daily.  . ticagrelor (BRILINTA) 90 MG TABS tablet TAKE 1 TABLET (90 MG TOTAL) BY MOUTH 2 (TWO) TIMES DAILY.  . valsartan (DIOVAN) 80 MG tablet Take 1 tablet (80 mg total) by mouth daily.  . [DISCONTINUED] amLODipine (NORVASC) 5 MG tablet TAKE 1 TABLET BY MOUTH EVERY DAY  . [DISCONTINUED] atorvastatin (LIPITOR) 80 MG tablet TAKE 1 TABLET BY MOUTH EVERY DAY  . [DISCONTINUED] nitroGLYCERIN (NITROSTAT) 0.4 MG SL tablet Place 1 tablet (0.4 mg total) under the tongue every 5 (five) minutes as needed.     Allergies:   Bee venom   Social History   Socioeconomic History  . Marital status: Married    Spouse name: Not on file  . Number of children: Not on file  . Years of education: Not on file  . Highest education level: Not on file  Occupational History  . Not on file  Tobacco Use  . Smoking status: Never Smoker  . Smokeless tobacco: Never Used  Vaping Use  . Vaping Use: Never used  Substance and Sexual Activity  . Alcohol use: Not Currently  . Drug use: Never  . Sexual activity: Not on file  Other Topics Concern  . Not on file  Social History  Narrative  . Not on file   Social Determinants of Health   Financial Resource Strain: Not on file  Food Insecurity: Not on file  Transportation Needs: Not on file  Physical Activity: Not on file  Stress: Not on file  Social Connections: Not on file     Family History: The patient's family history includes Hyperlipidemia in his father; Hypertension in his father.  ROS:   Please see the history of present illness.    Review of Systems  Constitutional: Negative for chills and fever.  HENT: Negative for hearing loss and sore throat.   Eyes: Negative for blurred vision and  redness.  Respiratory: Negative for shortness of breath.   Cardiovascular: Negative for chest pain, palpitations, orthopnea, claudication, leg swelling and PND.  Gastrointestinal: Negative for melena, nausea and vomiting.  Genitourinary: Negative for flank pain and hematuria.  Musculoskeletal: Positive for myalgias.  Neurological: Negative for dizziness and loss of consciousness.  Endo/Heme/Allergies: Negative for polydipsia.  Psychiatric/Behavioral: The patient is not nervous/anxious.     EKGs/Labs/Other Studies Reviewed:    The following studies were reviewed today: TTE 07-10-19: IMPRESSIONS  1. Distal septal and inferior basal hypokinesis . Left ventricular  ejection fraction, by estimation, is 50 to 55%. The left ventricle has low  normal function. The left ventricle demonstrates regional wall motion  abnormalities (see scoring  diagram/findings for description). There is mild left ventricular  hypertrophy. Left ventricular diastolic parameters were normal.  2. Right ventricular systolic function is normal. The right ventricular  size is normal.  3. The mitral valve is normal in structure. Trivial mitral valve  regurgitation. No evidence of mitral stenosis.  4. The aortic valve is tricuspid. Aortic valve regurgitation is mild. No  aortic stenosis is present.  5. The inferior vena cava is normal in size with greater than 50%  respiratory variability, suggesting right atrial pressure of 3 mmHg.   Distal septal and inferior basal hypokinesis  Cath 2019-07-10: 1.  Severe proximal to mid LAD stenosis, treated successfully with PCI using a 3.0 x 22 mm resolute Onyx DES 2.  Mild nonobstructive left main, left circumflex, and RCA stenosis 3.  Normal LVEDP 4.  Normal LV function by echo assessment  Recommendations: Dual antiplatelet therapy with aspirin and ticagrelor x12 months.  Aggressive risk reduction measures.  Should be stable for hospital discharge tomorrow morning unless  any problems arise.   EKG:  EKG is  ordered today.  The ekg ordered today demonstrates sinus bradycardia at 57  Recent Labs: 06/14/2019: TSH 1.224 06/16/2019: BUN 12; Creatinine, Ser 0.87; Hemoglobin 15.7; Magnesium 2.1; Platelets 234; Potassium 4.4; Sodium 140 08/23/2019: ALT 23  Recent Lipid Panel    Component Value Date/Time   CHOL 131 08/23/2019 0743   TRIG 103 08/23/2019 0743   HDL 45 08/23/2019 0743   CHOLHDL 2.9 08/23/2019 0743   CHOLHDL 4.5 06/14/2019 1641   VLDL 14 06/14/2019 1641   LDLCALC 67 08/23/2019 0743      Physical Exam:    VS:  BP 138/80   Pulse 60   Ht 5\' 10"  (1.778 m)   Wt 197 lb (89.4 kg)   SpO2 99%   BMI 28.27 kg/m     Wt Readings from Last 3 Encounters:  05/12/20 197 lb (89.4 kg)  11/13/19 193 lb 6.4 oz (87.7 kg)  07/02/19 193 lb 12.8 oz (87.9 kg)     GEN:  Well nourished, well developed in no acute distress HEENT: Normal NECK: No JVD; No carotid  bruits CARDIAC: Bradycardic, regular, no murmurs, rubs, gallops RESPIRATORY:  Clear to auscultation without rales, wheezing or rhonchi  ABDOMEN: Soft, non-tender, non-distended MUSCULOSKELETAL:  No edema; No deformity  SKIN: Warm and dry NEUROLOGIC:  Alert and oriented x 3 PSYCHIATRIC:  Normal affect   ASSESSMENT:    1. Coronary artery disease involving native coronary artery of native heart without angina pectoris   2. Hyperlipidemia, unspecified hyperlipidemia type   3. Essential hypertension   4. Non-ST elevation (NSTEMI) myocardial infarction [facility](HCC)    PLAN:    In order of problems listed above:  #CAD s/p DES to LAD: Patient admitted 05/2019 for unstable angina. TTE showed LV function of 50 to 55%. Cardiac catheterization showed severe proximal LAD stenosis s/p PCI with DES. Otherwise, mild nonobstructive disease. On DAPT for 1 year. No anginal symptoms.  -Continue ASA 81mg  indefinitely -Can stop ticagrelor on 06/14/20 (1 year of DAPT) -Change atorvastatin to crestor 20mg  daily given  muscle aches -Change amlodipine 5mg  daily to valsaratan 80mg  daily -No BB due to bradycardia  #HTN: -Change amlodipine to valsartan 80mg  daily given history of MI -Stopping amlodipine should help with constipation as well -Check BMET next week  #HLD: -Change atorvastatin 80mg  to crestor 20mg  daily as above -Lipids next week with goal LDL <70     Medication Adjustments/Labs and Tests Ordered: Current medicines are reviewed at length with the patient today.  Concerns regarding medicines are outlined above.  Orders Placed This Encounter  Procedures  . Basic metabolic panel  . Lipid panel   Meds ordered this encounter  Medications  . nitroGLYCERIN (NITROSTAT) 0.4 MG SL tablet    Sig: Place 1 tablet (0.4 mg total) under the tongue every 5 (five) minutes as needed.    Dispense:  25 tablet    Refill:  6  . valsartan (DIOVAN) 80 MG tablet    Sig: Take 1 tablet (80 mg total) by mouth daily.    Dispense:  90 tablet    Refill:  3  . rosuvastatin (CRESTOR) 20 MG tablet    Sig: Take 1 tablet (20 mg total) by mouth daily.    Dispense:  90 tablet    Refill:  3    Patient Instructions  Medication Instructions:  STOP ATORVASTATIN STOP AMLODIPINE START VALSARTAN 80 MG DAILY START ROSUVASTATIN  20 MG EVERY DAY  *If you need a refill on your cardiac medications before your next appointment, please call your pharmacy*   Lab Work: FASTING BMET AND LIPID IN A WEEK If you have labs (blood work) drawn today and your tests are completely normal, you will receive your results only by: Marland Kitchen. MyChart Message (if you have MyChart) OR . A paper copy in the mail If you have any lab test that is abnormal or we need to change your treatment, we will call you to review the results.   Testing/Procedures: NONE   Follow-Up: At [facility], you and your health needs are our priority.  As part of our continuing mission to provide you with exceptional heart care, we have created designated Provider  Care Teams.  These Care Teams include your primary Cardiologist (physician) and Advanced Practice Providers (APPs -  Physician Assistants and Nurse Practitioners) who all work together to provide you with the care you need, when you need it.  We recommend signing up for the patient portal called "MyChart".  Sign up information is provided on this After Visit Summary.  MyChart is used to connect with patients for Virtual  Visits (Telemedicine).  Patients are able to view lab/test results, encounter notes, upcoming appointments, etc.  Non-urgent messages can be sent to your provider as well.   To learn more about what you can do with MyChart, go to [email or web address].    Your next appointment:   3 month(s)  The format for your next appointment:   In Person  Provider:   Laurance Flatten, MD   Other Instructions NONE     Signed, Meriam Sprague, MD  05/12/2020 10:07 AM    [facility]

## 2020-05-12 ENCOUNTER — Other Ambulatory Visit: Payer: Self-pay

## 2020-05-12 ENCOUNTER — Ambulatory Visit: Payer: Medicare HMO | Admitting: Cardiology

## 2020-05-12 ENCOUNTER — Encounter: Payer: Self-pay | Admitting: Cardiology

## 2020-05-12 VITALS — BP 138/80 | HR 60 | Ht 70.0 in | Wt 197.0 lb

## 2020-05-12 DIAGNOSIS — I2510 Atherosclerotic heart disease of native coronary artery without angina pectoris: Secondary | ICD-10-CM | POA: Diagnosis not present

## 2020-05-12 DIAGNOSIS — I10 Essential (primary) hypertension: Secondary | ICD-10-CM

## 2020-05-12 DIAGNOSIS — I214 Non-ST elevation (NSTEMI) myocardial infarction: Secondary | ICD-10-CM | POA: Diagnosis not present

## 2020-05-12 DIAGNOSIS — E785 Hyperlipidemia, unspecified: Secondary | ICD-10-CM

## 2020-05-12 MED ORDER — VALSARTAN 80 MG PO TABS: 80.0000 mg | ORAL_TABLET | Freq: Every day | ORAL | 3 refills | Status: DC

## 2020-05-12 MED ORDER — NITROGLYCERIN 0.4 MG SL SUBL: 0.4000 mg | SUBLINGUAL_TABLET | SUBLINGUAL | 6 refills | Status: AC | PRN

## 2020-05-12 MED ORDER — ROSUVASTATIN CALCIUM 20 MG PO TABS: 20.0000 mg | ORAL_TABLET | Freq: Every day | ORAL | 3 refills | Status: DC

## 2020-05-12 NOTE — Patient Instructions (Addendum)
Medication Instructions:  STOP ATORVASTATIN STOP AMLODIPINE START VALSARTAN 80 MG DAILY START ROSUVASTATIN  20 MG EVERY DAY  *If you need a refill on your cardiac medications before your next appointment, please call your pharmacy*   Lab Work: FASTING BMET AND LIPID IN A WEEK If you have labs (blood work) drawn today and your tests are completely normal, you will receive your results only by: Marland Kitchen MyChart Message (if you have MyChart) OR . A paper copy in the mail If you have any lab test that is abnormal or we need to change your treatment, we will call you to review the results.   Testing/Procedures: NONE   Follow-Up: At [facility], you and your health needs are our priority.  As part of our continuing mission to provide you with exceptional heart care, we have created designated Provider Care Teams.  These Care Teams include your primary Cardiologist (physician) and Advanced Practice Providers (APPs -  Physician Assistants and Nurse Practitioners) who all work together to provide you with the care you need, when you need it.  We recommend signing up for the patient portal called "MyChart".  Sign up information is provided on this After Visit Summary.  MyChart is used to connect with patients for Virtual Visits (Telemedicine).  Patients are able to view lab/test results, encounter notes, upcoming appointments, etc.  Non-urgent messages can be sent to your provider as well.   To learn more about what you can do with MyChart, go to [email or web address].    Your next appointment:   3 month(s)  The format for your next appointment:   In Person  Provider:   Laurance Flatten, MD   Other Instructions NONE

## 2020-05-23 NOTE — Addendum Note (Signed)
Addended by: Oleta Mouse on: 05/23/2020 02:27 PM   Modules accepted: Orders

## 2020-06-23 ENCOUNTER — Other Ambulatory Visit: Payer: Self-pay | Admitting: Cardiology

## 2020-08-14 ENCOUNTER — Telehealth: Payer: Self-pay | Admitting: Cardiology

## 2020-08-14 DIAGNOSIS — I2510 Atherosclerotic heart disease of native coronary artery without angina pectoris: Secondary | ICD-10-CM

## 2020-08-14 DIAGNOSIS — E785 Hyperlipidemia, unspecified: Secondary | ICD-10-CM

## 2020-08-14 DIAGNOSIS — I10 Essential (primary) hypertension: Secondary | ICD-10-CM

## 2020-08-14 DIAGNOSIS — Z79899 Other long term (current) drug therapy: Secondary | ICD-10-CM

## 2020-08-14 DIAGNOSIS — I25110 Atherosclerotic heart disease of native coronary artery with unstable angina pectoris: Secondary | ICD-10-CM

## 2020-08-14 NOTE — Telephone Encounter (Signed)
New Message:      Pt wants to know if he needs his lab work before his appt on 08-19-20?

## 2020-08-14 NOTE — Telephone Encounter (Signed)
Pt was to have fasting lipids and BMET done as ordered by Dr. Shari Prows at last office visit with the pt. Pt is wanting to know if he can get them done tomorrow, so that the results can be available for his OV with her on 6/28. Scheduled the pt to come into the office tomorrow 6/24 for fasting lipids and BMET. He is aware to come fasting to this lab appt. Pt verbalized understanding and agrees with this plan.

## 2020-08-15 ENCOUNTER — Other Ambulatory Visit: Payer: Self-pay

## 2020-08-15 ENCOUNTER — Other Ambulatory Visit: Payer: Medicare HMO | Admitting: *Deleted

## 2020-08-15 DIAGNOSIS — Z79899 Other long term (current) drug therapy: Secondary | ICD-10-CM

## 2020-08-15 DIAGNOSIS — I2510 Atherosclerotic heart disease of native coronary artery without angina pectoris: Secondary | ICD-10-CM

## 2020-08-15 DIAGNOSIS — E785 Hyperlipidemia, unspecified: Secondary | ICD-10-CM

## 2020-08-15 DIAGNOSIS — I25110 Atherosclerotic heart disease of native coronary artery with unstable angina pectoris: Secondary | ICD-10-CM

## 2020-08-15 DIAGNOSIS — I10 Essential (primary) hypertension: Secondary | ICD-10-CM

## 2020-08-15 LAB — BASIC METABOLIC PANEL
BUN/Creatinine Ratio: 16 (ref 10–24)
BUN: 18 mg/dL (ref 8–27)
CO2: 21 mmol/L (ref 20–29)
Calcium: 10.1 mg/dL (ref 8.6–10.2)
Chloride: 106 mmol/L (ref 96–106)
Creatinine, Ser: 1.15 mg/dL (ref 0.76–1.27)
Glucose: 99 mg/dL (ref 65–99)
Potassium: 4.6 mmol/L (ref 3.5–5.2)
Sodium: 140 mmol/L (ref 134–144)
eGFR: 68 mL/min/{1.73_m2} (ref >59)

## 2020-08-15 LAB — LIPID PANEL
Chol/HDL Ratio: 2.3 ratio (ref 0.0–5.0)
Cholesterol, Total: 125 mg/dL (ref 100–199)
HDL: 54 mg/dL (ref >39)
LDL Chol Calc (NIH): 57 mg/dL (ref 0–99)
Triglycerides: 70 mg/dL (ref 0–149)
VLDL Cholesterol Cal: 14 mg/dL (ref 5–40)

## 2020-08-17 NOTE — Progress Notes (Signed)
Cardiology Office Note:    Date:  08/19/2020   ID:  Joseph Jenkins, DOB [birth year], MRN [id number]  PCP:  Barbie Banner, MD   [facility]  Cardiologist:  Tobias Alexander, MD (Inactive)  Advanced Practice Provider:  No care team member to display Electrophysiologist:  None   Referring MD: Barbie Banner, MD    History of Present Illness:    Joseph Jenkins is a 70 y.o. male with a hx of CAD s/p DES to mLAD 05/2019, HTN and HLD who was previously followed by Dr. Delton See who now presents to clinic for follow-up.   Patient admitted 05/2019 for unstable angina. TTE showed LVEF of 50 to 55%. Cardiac catheterization showed severe proximal LAD stenosis s/p PCI with DES. Otherwise, mild nonobstructive disease. Recommended dual antiplatelet therapy with aspirin and Brilinta for 12 months.  No beta-blocker given bradycardia. Placed on statin therapy.  Last seen in clinic on 05/12/20 where he was doing well with no anginal symptoms.  Today, the patient states that he overall doing well. No anginal symptoms. Remains active and is able to run, walk, work out with weights without symptoms. No LE edema, orthopnea, PND or palpitations. Has remained on ASA and now off brilinta.  Continues to have muscle aches on crestor and is hoping to decrease his dose. Last LDL was 57 on 08/15/20.  Past Medical History:  Diagnosis Date   CAD (coronary artery disease), native coronary artery    S/p DES to mid LAD 06/15/2019   Hyperlipidemia LDL goal <70    Hypertension     Past Surgical History:  Procedure Laterality Date   CORONARY STENT INTERVENTION N/A 06/15/2019   Procedure: CORONARY STENT INTERVENTION;  Surgeon: Tonny Bollman, MD;  Location: [facility] INVASIVE CV LAB;  Service: Cardiovascular;  Laterality: N/A;   LEFT HEART CATH AND CORONARY ANGIOGRAPHY N/A 06/15/2019   Procedure: LEFT HEART CATH AND CORONARY ANGIOGRAPHY;  Surgeon: Tonny Bollman, MD;  Location: [facility] INVASIVE CV LAB;  Service:  Cardiovascular;  Laterality: N/A;    Current Medications: Current Meds  Medication Sig   aspirin EC 81 MG tablet Take 81 mg by mouth daily.   Lactobacillus (PROBIOTIC ACIDOPHILUS PO) Take 1 tablet by mouth.   rosuvastatin (CRESTOR) 10 MG tablet Take 1 tablet (10 mg total) by mouth daily.   valsartan (DIOVAN) 80 MG tablet Take 1 tablet (80 mg total) by mouth daily.     Allergies:   Bee venom   Social History   Socioeconomic History   Marital status: Married    Spouse name: Not on file   Number of children: Not on file   Years of education: Not on file   Highest education level: Not on file  Occupational History   Not on file  Tobacco Use   Smoking status: Never   Smokeless tobacco: Never  Vaping Use   Vaping Use: Never used  Substance and Sexual Activity   Alcohol use: Not Currently   Drug use: Never   Sexual activity: Not on file  Other Topics Concern   Not on file  Social History Narrative   Not on file   Social Determinants of Health   Financial Resource Strain: Not on file  Food Insecurity: Not on file  Transportation Needs: Not on file  Physical Activity: Not on file  Stress: Not on file  Social Connections: Not on file     Family History: The patient's family history includes Hyperlipidemia in his father; Hypertension in his  father.  ROS:   Please see the history of present illness.    Review of Systems  Constitutional:  Negative for chills and fever.  HENT:  Negative for hearing loss and sore throat.   Eyes:  Negative for blurred vision and redness.  Respiratory:  Negative for shortness of breath.   Cardiovascular:  Negative for chest pain, palpitations, orthopnea, claudication, leg swelling and PND.  Gastrointestinal:  Negative for melena, nausea and vomiting.  Genitourinary:  Negative for flank pain and hematuria.  Musculoskeletal:  Positive for myalgias.  Neurological:  Negative for dizziness and loss of consciousness.  Endo/Heme/Allergies:   Negative for polydipsia.  Psychiatric/Behavioral:  The patient is not nervous/anxious.    EKGs/Labs/Other Studies Reviewed:    The following studies were reviewed today: TTE 07-09-19: IMPRESSIONS   1. Distal septal and inferior basal hypokinesis . Left ventricular  ejection fraction, by estimation, is 50 to 55%. The left ventricle has low  normal function. The left ventricle demonstrates regional wall motion  abnormalities (see scoring  diagram/findings for description). There is mild left ventricular  hypertrophy. Left ventricular diastolic parameters were normal.   2. Right ventricular systolic function is normal. The right ventricular  size is normal.   3. The mitral valve is normal in structure. Trivial mitral valve  regurgitation. No evidence of mitral stenosis.   4. The aortic valve is tricuspid. Aortic valve regurgitation is mild. No  aortic stenosis is present.   5. The inferior vena cava is normal in size with greater than 50%  respiratory variability, suggesting right atrial pressure of 3 mmHg.   Distal septal and inferior basal hypokinesis  Cath Jul 09, 2019: 1.  Severe proximal to mid LAD stenosis, treated successfully with PCI using a 3.0 x 22 mm resolute Onyx DES 2.  Mild nonobstructive left main, left circumflex, and RCA stenosis 3.  Normal LVEDP 4.  Normal LV function by echo assessment   Recommendations: Dual antiplatelet therapy with aspirin and ticagrelor x12 months.  Aggressive risk reduction measures.  Should be stable for hospital discharge tomorrow morning unless any problems arise.   EKG:  No new tracing  Recent Labs: 08/23/2019: ALT 23 08/15/2020: BUN 18; Creatinine, Ser 1.15; Potassium 4.6; Sodium 140  Recent Lipid Panel    Component Value Date/Time   CHOL 125 08/15/2020 0747   TRIG 70 08/15/2020 0747   HDL 54 08/15/2020 0747   CHOLHDL 2.3 08/15/2020 0747   CHOLHDL 4.5 06/14/2019 1641   VLDL 14 06/14/2019 1641   LDLCALC 57 08/15/2020 0747       Physical Exam:    VS:  BP 128/80   Pulse 67   Ht 5\' 10"  (1.778 m)   Wt 192 lb 12.8 oz (87.5 kg)   SpO2 95%   BMI 27.66 kg/m     Wt Readings from Last 3 Encounters:  08/19/20 192 lb 12.8 oz (87.5 kg)  05/12/20 197 lb (89.4 kg)  11/13/19 193 lb 6.4 oz (87.7 kg)     GEN:  Well nourished, well developed in no acute distress HEENT: Normal NECK: No JVD; No carotid bruits CARDIAC: RRR, no murmurs RESPIRATORY:  Clear to auscultation without rales, wheezing or rhonchi  ABDOMEN: Soft, non-tender, non-distended MUSCULOSKELETAL:  No edema; No deformity  SKIN: Warm and dry NEUROLOGIC:  Alert and oriented x 3 PSYCHIATRIC:  Normal affect   ASSESSMENT:    1. Medication management   2. Hyperlipidemia, unspecified hyperlipidemia type   3. Coronary artery disease involving native coronary artery of native  heart without angina pectoris     PLAN:    In order of problems listed above:  #CAD s/p DES to LAD: Patient admitted 05/2019 for unstable angina. TTE showed LV function of 50 to 55%. Cardiac catheterization showed severe proximal LAD stenosis s/p PCI with DES. Otherwise, mild nonobstructive disease. On DAPT for 1 year. No anginal symptoms.  -Continue ASA 81mg  indefinitely -Off ticagrelor after 1 year of DAPT -Will decrease crestor to 10mg  daily due to muscle aches  -Continue valsartan 80mg  daily -No BB due to bradycardia  #HTN: Controlled with BP 128/80 today. -Continue valsartan 80mg  daily  #HLD: -Will change to crestor 10mg  daily per patient preference due to muscle aches -Repeat lipids in 6 weeks with goal LDL <70; last was at goal of 57 on 08/15/20  #Mild AI: Noted on TTE on 06/15/19. -Serial monitoring with next TTE 05/2022  Medication Adjustments/Labs and Tests Ordered: Current medicines are reviewed at length with the patient today.  Concerns regarding medicines are outlined above.  Orders Placed This Encounter  Procedures   Lipid Profile    Meds ordered  this encounter  Medications   rosuvastatin (CRESTOR) 10 MG tablet    Sig: Take 1 tablet (10 mg total) by mouth daily.    Dispense:  90 tablet    Refill:  2    Dose decrease     There are no Patient Instructions on file for this visit.    Signed, , MD  08/19/2020 8:31 AM    [facility]

## 2020-08-19 ENCOUNTER — Other Ambulatory Visit: Payer: Self-pay

## 2020-08-19 ENCOUNTER — Ambulatory Visit: Payer: Medicare HMO | Admitting: Cardiology

## 2020-08-19 VITALS — BP 128/80 | HR 67 | Ht 70.0 in | Wt 192.8 lb

## 2020-08-19 DIAGNOSIS — I10 Essential (primary) hypertension: Secondary | ICD-10-CM

## 2020-08-19 DIAGNOSIS — Z79899 Other long term (current) drug therapy: Secondary | ICD-10-CM | POA: Diagnosis not present

## 2020-08-19 DIAGNOSIS — E785 Hyperlipidemia, unspecified: Secondary | ICD-10-CM

## 2020-08-19 DIAGNOSIS — I2510 Atherosclerotic heart disease of native coronary artery without angina pectoris: Secondary | ICD-10-CM

## 2020-08-19 DIAGNOSIS — I214 Non-ST elevation (NSTEMI) myocardial infarction: Secondary | ICD-10-CM

## 2020-08-19 MED ORDER — ROSUVASTATIN CALCIUM 10 MG PO TABS: 10.0000 mg | ORAL_TABLET | Freq: Every day | ORAL | 2 refills | Status: DC

## 2020-08-19 NOTE — Patient Instructions (Signed)
Medication Instructions:   DECREASE YOUR ROSUVASTATIN (CRESTOR) TO 10 MG BY MOUTH DAILY  *If you need a refill on your cardiac medications before your next appointment, please call your pharmacy*   Lab Work:  IN 6 WEEKS TO CHECK LIPIDS--PLEASE COME FASTING TO THIS LAB APPOINTMENT  If you have labs (blood work) drawn today and your tests are completely normal, you will receive your results only by: MyChart Message (if you have MyChart) OR A paper copy in the mail If you have any lab test that is abnormal or we need to change your treatment, we will call you to review the results.   Follow-Up: At [facility], you and your health needs are our priority.  As part of our continuing mission to provide you with exceptional heart care, we have created designated Provider Care Teams.  These Care Teams include your primary Cardiologist (physician) and Advanced Practice Providers (APPs -  Physician Assistants and Nurse Practitioners) who all work together to provide you with the care you need, when you need it.  We recommend signing up for the patient portal called "MyChart".  Sign up information is provided on this After Visit Summary.  MyChart is used to connect with patients for Virtual Visits (Telemedicine).  Patients are able to view lab/test results, encounter notes, upcoming appointments, etc.  Non-urgent messages can be sent to your provider as well.   To learn more about what you can do with MyChart, go to [email or web address].    Your next appointment:   8 month(s)  The format for your next appointment:   In Person  Provider:   You may see Dr. Shari Prows or one of the following Advanced Practice Providers on your designated Care Team:   Tereso Newcomer, PA-C [place], [place]

## 2020-09-30 ENCOUNTER — Other Ambulatory Visit: Payer: Medicare HMO

## 2020-10-18 IMAGING — DX DG CHEST 2V
2 series · 2 of 2 positions shown · non-contrast
Comparison: None.

CLINICAL DATA: Chest pain and shortness of breath for 5-6 days.

EXAM:
CHEST - 2 VIEW

[chest pa]
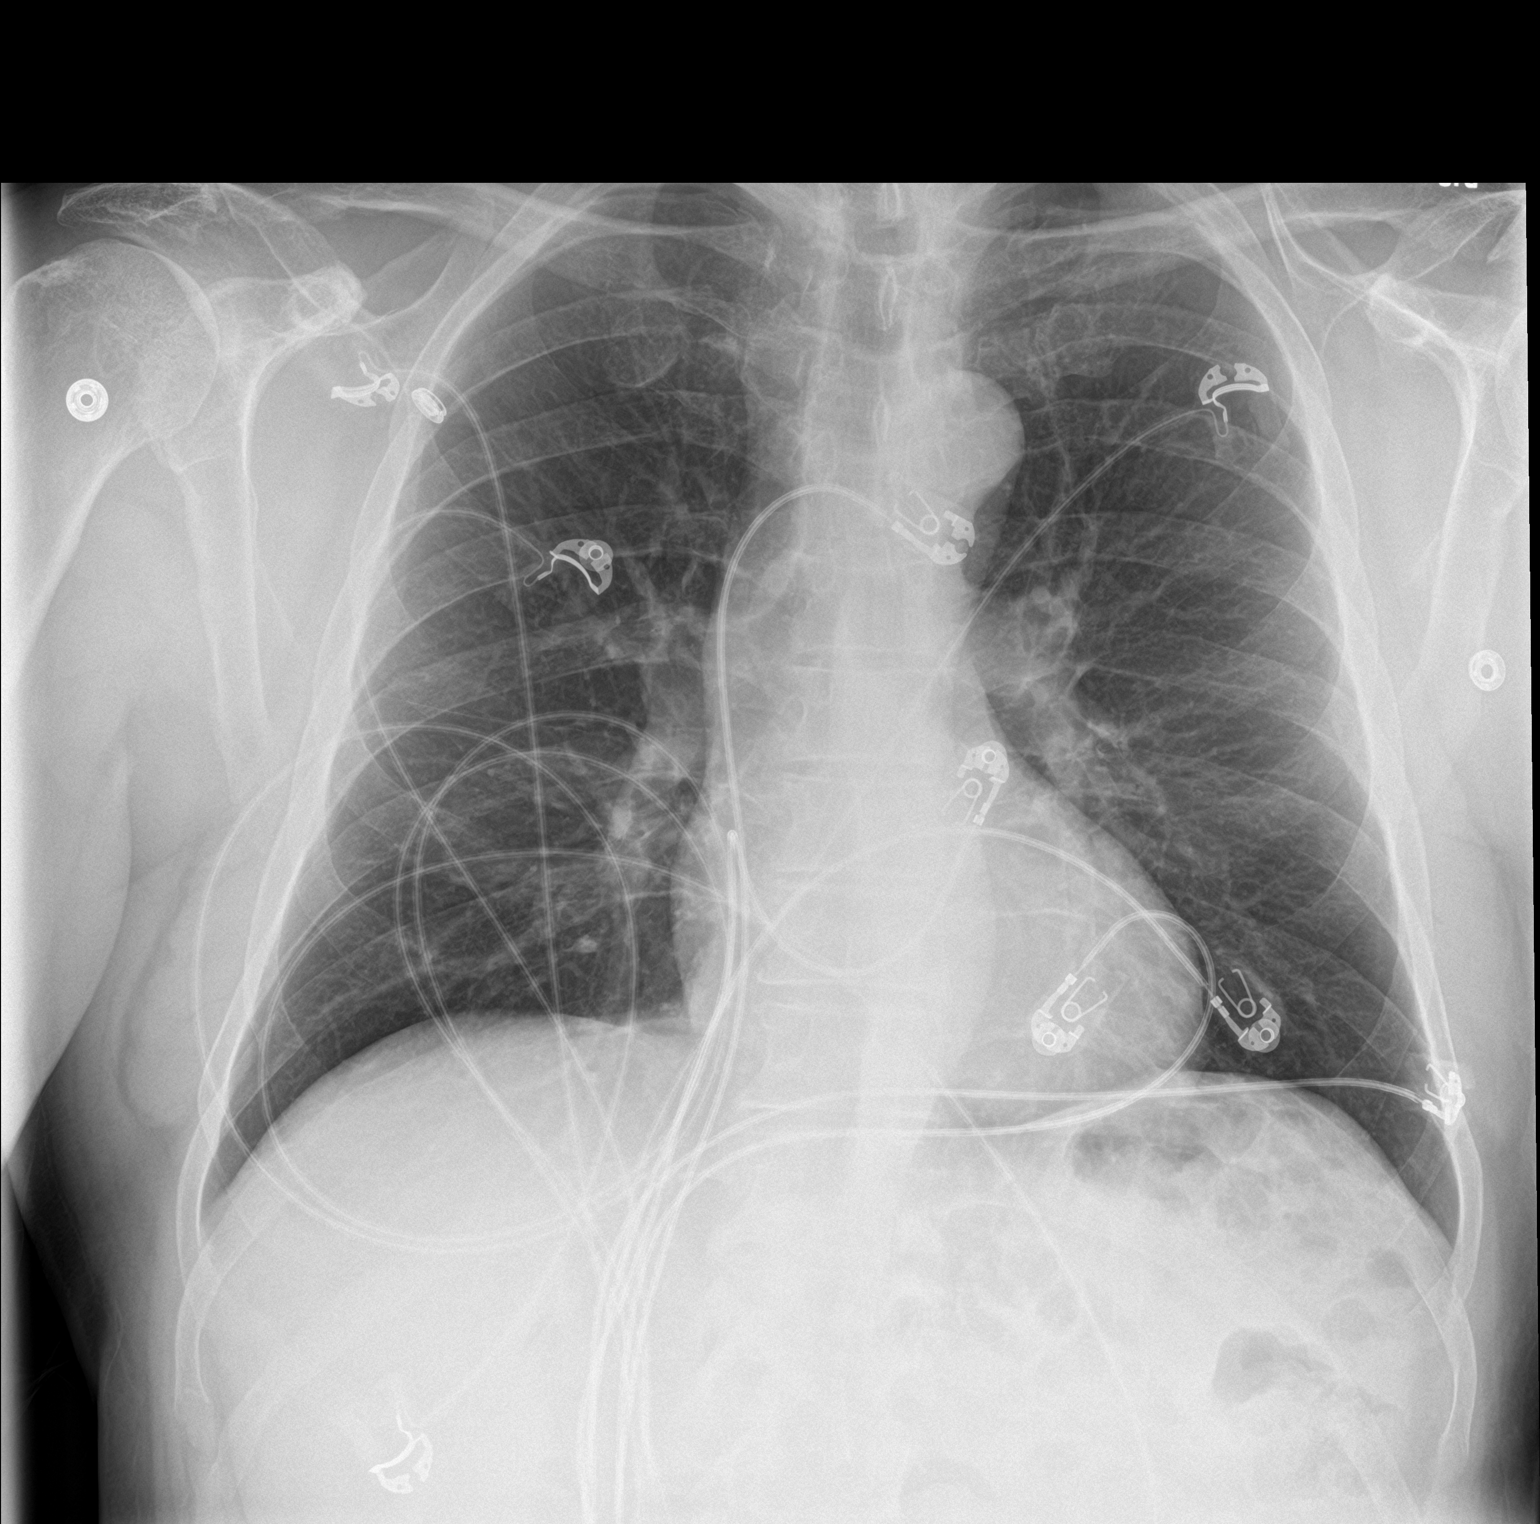

[chest lat]
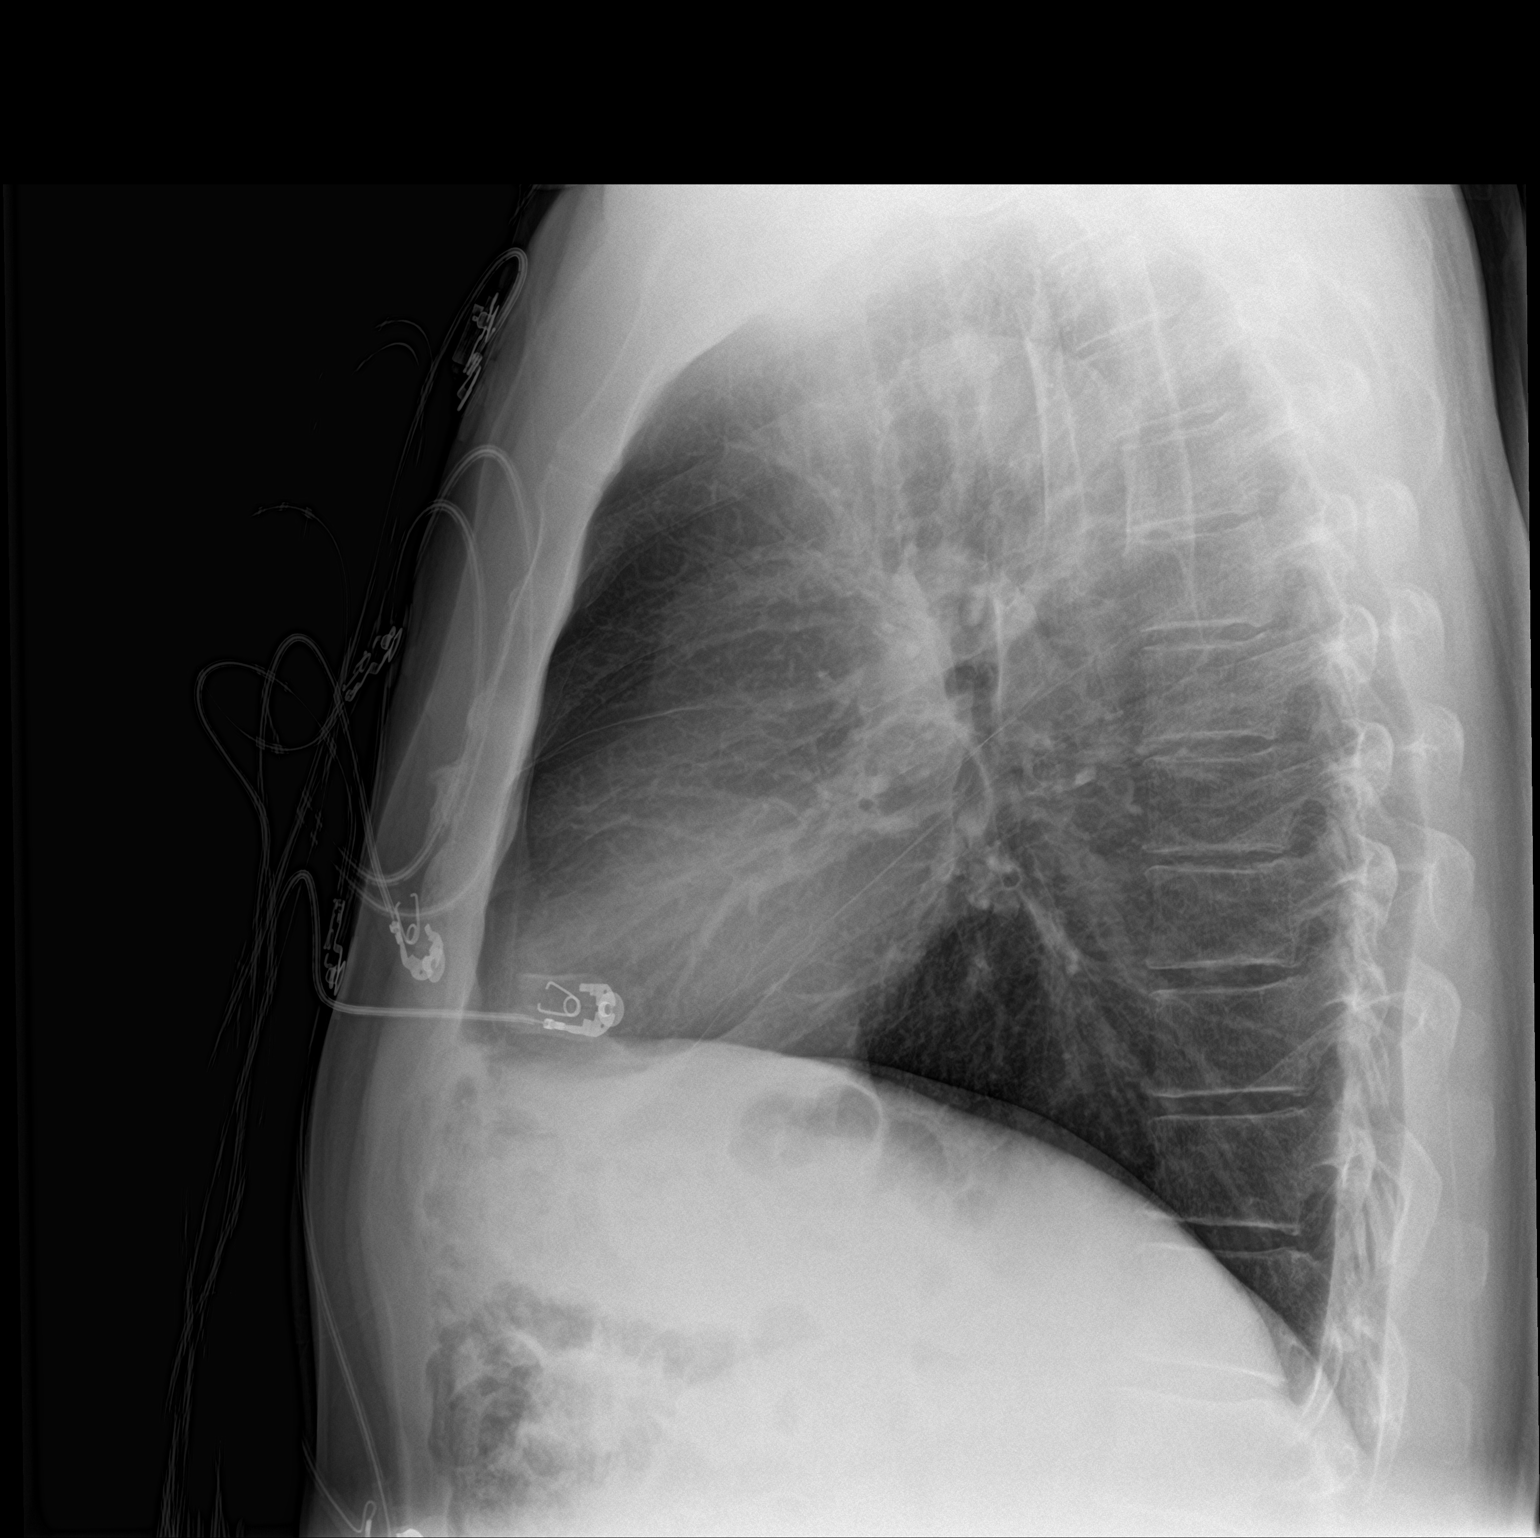

[2 of 2 positions shown; findings below may reference images not displayed]

FINDINGS: Lungs clear. Heart size normal. No pneumothorax or pleural effusion.
No acute or focal bony abnormality.
IMPRESSION: Negative chest.

## 2021-01-27 ENCOUNTER — Telehealth: Payer: Self-pay | Admitting: Cardiology

## 2021-01-27 MED ORDER — AMLODIPINE BESYLATE 5 MG PO TABS: 5.0000 mg | ORAL_TABLET | Freq: Every day | ORAL | 1 refills | Status: DC

## 2021-01-27 NOTE — Telephone Encounter (Signed)
Pt c/o medication issue:  1. Name of Medication: valsartan (DIOVAN) 80 MG tablet  2. How are you currently taking this medication (dosage and times per day)? Take 1 tablet (80 mg total) by mouth daily.  3. Are you having a reaction (difficulty breathing--STAT)? no  4. What is your medication issue? Patient wasn't to be switch bout to the medication he was on before this one. He states that this current medication make him very fatigue. Please advise

## 2021-01-27 NOTE — Telephone Encounter (Signed)
Meriam Sprague, MD  Sent: Tue January 27, 2021 11:13 AM  To: Loa Socks, LPN          Message  If willing to go down on the dose to 40mg , it is the better med for his heart. If he just like the amlodipine better, he can switch back.    Spoke with the pt.  He is not wanting to reduce the dose of his Valsartan, despite it being a better choice for his heart.  Pt would like to discontinue valsartan and start back taking amlodipine 5 mg po daily. Pt states he will let know if he's still experiencing feelings of being fatigued, on the amlodipine regimen if needed.  Confirmed the pharmacy of choice with the pt.  Sent in amlodipine 5 mg po daily for him.  He is aware to stop valsartan. Pt verbalized understanding and agrees with this plan.

## 2021-01-27 NOTE — Telephone Encounter (Signed)
Pt is calling in as advised by his  PCP  to report about his complaints of feeling fatigued for sometime, which may be associated with him taking Valsartan. Pt states he noticed becoming fatigued ever since he was taken off of amlodipine and switched to Valsartan at OV in March with Dr. Shari Prows.  Pt denies any other cardiac complaints.  Pt reports he goes to the gym 2-3 times a week and experiences no cardiac symptoms during exercise.  Pt states he just had labs done by his PCP on 11/29 (in epic) and everything came back normal including his TSH level.  Pt was switched from amlodipine to valsartan in March due to history of MI and to help with complaints of constipation.  Pt has a history  of NSTEMI in April 2021 with proximal DES to LAD. Pt is asking if he needs to be seen to have this addressed or does Dr. Shari Prows think it's acceptable for him to switch back to taking amlodipine 5 mg po daily and drop the valsartan. Pt states he infrequently monitors his pressures at home and reports they stay in the 120s/80, with last pressure 122/80. Informed the pt that I will route this message to Dr. Shari Prows to further review and advise on.  Informed him I will follow-up with him accordingly thereafter.  Pt verbalized understanding and agrees with this plan.

## 2021-06-12 NOTE — Progress Notes (Deleted)
?Cardiology Office Note:   ? ?Date:  06/12/2021  ? ?IDTremond Jenkins, DOB [birth year], MRN [id number] ? ?PCP:  Barbie Banner, MD ?  ?[facility]  ?Cardiologist:  Tobias Alexander, MD  ?Advanced Practice Provider:  No care team member to display ?Electrophysiologist:  None  ? ?Referring MD: Barbie Banner, MD  ? ? ?History of Present Illness:   ? ?Joseph Jenkins is a 71 y.o. male with a hx of CAD s/p DES to mLAD 05/2019, HTN and HLD who was previously followed by Dr. Delton See who now presents to clinic for follow-up.  ? ?Patient admitted 05/2019 for unstable angina. TTE showed LVEF of 50 to 55%. Cardiac catheterization showed severe proximal LAD stenosis s/p PCI with DES. Otherwise, mild nonobstructive disease. Recommended dual antiplatelet therapy with aspirin and Brilinta for 12 months.  No beta-blocker given bradycardia. Placed on statin therapy. ? ?Was last seen in clinic on 07/2020 where he was doing well. Was having muscle aches to the crestor and dose was decreased to 10mg  daily.  ? ?Today, *** ? ?Past Medical History:  ?Diagnosis Date  ? CAD (coronary artery disease), native coronary artery   ? S/p DES to mid LAD 06/15/2019  ? Hyperlipidemia LDL goal <70   ? Hypertension   ? ? ?Past Surgical History:  ?Procedure Laterality Date  ? CORONARY STENT INTERVENTION N/A 06/15/2019  ? Procedure: CORONARY STENT INTERVENTION;  Surgeon: 06/17/2019, MD;  Location: [facility] INVASIVE CV LAB;  Service: Cardiovascular;  Laterality: N/A;  ? LEFT HEART CATH AND CORONARY ANGIOGRAPHY N/A 06/15/2019  ? Procedure: LEFT HEART CATH AND CORONARY ANGIOGRAPHY;  Surgeon: 06/17/2019, MD;  Location: [facility] INVASIVE CV LAB;  Service: Cardiovascular;  Laterality: N/A;  ? ? ?Current Medications: ?No outpatient medications have been marked as taking for the 06/17/21 encounter (Appointment) with 06/19/21, MD.  ?  ? ?Allergies:   Bee venom  ? ?Social History  ? ?Socioeconomic History  ? Marital status: Married  ?   Spouse name: Not on file  ? Number of children: Not on file  ? Years of education: Not on file  ? Highest education level: Not on file  ?Occupational History  ? Not on file  ?Tobacco Use  ? Smoking status: Never  ? Smokeless tobacco: Never  ?Vaping Use  ? Vaping Use: Never used  ?Substance and Sexual Activity  ? Alcohol use: Not Currently  ? Drug use: Never  ? Sexual activity: Not on file  ?Other Topics Concern  ? Not on file  ?Social History Narrative  ? Not on file  ? ?Social Determinants of Health  ? ?Financial Resource Strain: Not on file  ?Food Insecurity: Not on file  ?Transportation Needs: Not on file  ?Physical Activity: Not on file  ?Stress: Not on file  ?Social Connections: Not on file  ?  ? ?Family History: ?The patient's family history includes Hyperlipidemia in his father; Hypertension in his father. ? ?ROS:   ?Please see the history of present illness.    ?Review of Systems  ?Constitutional:  Negative for chills and fever.  ?HENT:  Negative for hearing loss and sore throat.   ?Eyes:  Negative for blurred vision and redness.  ?Respiratory:  Negative for shortness of breath.   ?Cardiovascular:  Negative for chest pain, palpitations, orthopnea, claudication, leg swelling and PND.  ?Gastrointestinal:  Negative for melena, nausea and vomiting.  ?Genitourinary:  Negative for flank pain and hematuria.  ?Musculoskeletal:  Positive for  myalgias.  ?Neurological:  Negative for dizziness and loss of consciousness.  ?Endo/Heme/Allergies:  Negative for polydipsia.  ?Psychiatric/Behavioral:  The patient is not nervous/anxious.   ? ?EKGs/Labs/Other Studies Reviewed:   ? ?The following studies were reviewed today: ?TTE 06/15/19: ?IMPRESSIONS  ? 1. Distal septal and inferior basal hypokinesis . Left ventricular  ?ejection fraction, by estimation, is 50 to 55%. The left ventricle has low  ?normal function. The left ventricle demonstrates regional wall motion  ?abnormalities (see scoring  ?diagram/findings for  description). There is mild left ventricular  ?hypertrophy. Left ventricular diastolic parameters were normal.  ? 2. Right ventricular systolic function is normal. The right ventricular  ?size is normal.  ? 3. The mitral valve is normal in structure. Trivial mitral valve  ?regurgitation. No evidence of mitral stenosis.  ? 4. The aortic valve is tricuspid. Aortic valve regurgitation is mild. No  ?aortic stenosis is present.  ? 5. The inferior vena cava is normal in size with greater than 50%  ?respiratory variability, suggesting right atrial pressure of 3 mmHg.  ? ?Distal septal and inferior basal hypokinesis ? ?Cath 06/15/19: ?1.  Severe proximal to mid LAD stenosis, treated successfully with PCI using a 3.0 x 22 mm resolute Onyx DES ?2.  Mild nonobstructive left main, left circumflex, and RCA stenosis ?3.  Normal LVEDP ?4.  Normal LV function by echo assessment ?  ?Recommendations: Dual antiplatelet therapy with aspirin and ticagrelor x12 months.  Aggressive risk reduction measures.  Should be stable for hospital discharge tomorrow morning unless any problems arise. ? ? ?EKG:  No new tracing ? ?Recent Labs: ?08/15/2020: BUN 18; Creatinine, Ser 1.15; Potassium 4.6; Sodium 140  ?Recent Lipid Panel ?   ?Component Value Date/Time  ? CHOL 125 08/15/2020 0747  ? TRIG 70 08/15/2020 0747  ? HDL 54 08/15/2020 0747  ? CHOLHDL 2.3 08/15/2020 0747  ? CHOLHDL 4.5 06/14/2019 1641  ? VLDL 14 06/14/2019 1641  ? LDLCALC 57 08/15/2020 0747  ? ? ? ? ?Physical Exam:   ? ?VS:  There were no vitals taken for this visit.   ? ?Wt Readings from Last 3 Encounters:  ?08/19/20 192 lb 12.8 oz (87.5 kg)  ?05/12/20 197 lb (89.4 kg)  ?11/13/19 193 lb 6.4 oz (87.7 kg)  ?  ? ?GEN:  Well nourished, well developed in no acute distress ?HEENT: Normal ?NECK: No JVD; No carotid bruits ?CARDIAC: RRR, no murmurs ?RESPIRATORY:  Clear to auscultation without rales, wheezing or rhonchi  ?ABDOMEN: Soft, non-tender, non-distended ?MUSCULOSKELETAL:  No edema; No  deformity  ?SKIN: Warm and dry ?NEUROLOGIC:  Alert and oriented x 3 ?PSYCHIATRIC:  Normal affect  ? ?ASSESSMENT:   ? ?No diagnosis found. ? ? ?PLAN:   ? ?In order of problems listed above: ? ?#CAD s/p DES to LAD: ?Patient admitted 05/2019 for unstable angina. TTE showed LV function of 50 to 55%. Cardiac catheterization showed severe proximal LAD stenosis s/p PCI with DES. Otherwise, mild nonobstructive disease. On DAPT for 1 year. No anginal symptoms.  ?-Continue ASA 81mg  indefinitely ?-Off ticagrelor after 1 year of DAPT ?-Continue crestor 10mg  daily (did not tolerate higher dose due to myalgias) ?-Continue valsartan 80mg  daily ?-No BB due to bradycardia ? ?#HTN: ?Controlled with BP 128/80 today. ?-Continue valsartan 80mg  daily ? ?#HLD: ?-Continue crestor 10mg  daily (did not tolerate higher dose due to myalgias) ? ?#Mild AI: ?Noted on TTE on 06/15/19. ?-Serial monitoring with next TTE 05/2022 ? ?Medication Adjustments/Labs and Tests Ordered: ?Current medicines are reviewed at  length with the patient today.  Concerns regarding medicines are outlined above.  ?No orders of the defined types were placed in this encounter. ? ? ?No orders of the defined types were placed in this encounter. ? ? ? ?There are no Patient Instructions on file for this visit. ?  ? ?Signed, ?Meriam Sprague, MD  ?06/12/2021 3:29 PM    ?[facility] ?

## 2021-06-17 ENCOUNTER — Encounter: Payer: Self-pay | Admitting: Cardiology

## 2021-06-17 ENCOUNTER — Ambulatory Visit: Payer: Medicare HMO | Admitting: Cardiology

## 2021-06-17 VITALS — BP 132/80 | HR 57 | Ht 70.0 in | Wt 195.8 lb

## 2021-06-17 DIAGNOSIS — I214 Non-ST elevation (NSTEMI) myocardial infarction: Secondary | ICD-10-CM | POA: Diagnosis not present

## 2021-06-17 DIAGNOSIS — E785 Hyperlipidemia, unspecified: Secondary | ICD-10-CM | POA: Diagnosis not present

## 2021-06-17 DIAGNOSIS — Z79899 Other long term (current) drug therapy: Secondary | ICD-10-CM

## 2021-06-17 DIAGNOSIS — I10 Essential (primary) hypertension: Secondary | ICD-10-CM

## 2021-06-17 DIAGNOSIS — I2510 Atherosclerotic heart disease of native coronary artery without angina pectoris: Secondary | ICD-10-CM | POA: Diagnosis not present

## 2021-06-17 NOTE — Patient Instructions (Signed)
Medication Instructions:  ? ?Your physician recommends that you continue on your current medications as directed. Please refer to the Current Medication list given to you today. ? ?*If you need a refill on your cardiac medications before your next appointment, please call your pharmacy* ? ? ?Lab Work: ? ?NEXT WEEK HERE IN THE OFFICE--CHECK LIPIDS--COME FASTING TO THIS LAB APPOINTMENT ? ?If you have labs (blood work) drawn today and your tests are completely normal, you will receive your results only by: ?MyChart Message (if you have MyChart) OR ?A paper copy in the mail ?If you have any lab test that is abnormal or we need to change your treatment, we will call you to review the results. ? ? ?Follow-Up: ?At [facility], you and your health needs are our priority.  As part of our continuing mission to provide you with exceptional heart care, we have created designated Provider Care Teams.  These Care Teams include your primary Cardiologist (physician) and Advanced Practice Providers (APPs -  Physician Assistants and Nurse Practitioners) who all work together to provide you with the care you need, when you need it. ? ?We recommend signing up for the patient portal called "MyChart".  Sign up information is provided on this After Visit Summary.  MyChart is used to connect with patients for Virtual Visits (Telemedicine).  Patients are able to view lab/test results, encounter notes, upcoming appointments, etc.  Non-urgent messages can be sent to your provider as well.   ?To learn more about what you can do with MyChart, go to [email or web address].   ? ?Your next appointment:   ?1 year(s) ? ?The format for your next appointment:   ?In Person ? ?Provider:   ?DR. PEMBERTON ? ?Important Information About Sugar ? ? ? ? ? ? ?

## 2021-06-17 NOTE — Progress Notes (Signed)
Cardiology Office Note:    Date:  06/17/2021   ID:  [name], DOB 10-08-1950, MRN [id number]  PCP:  Barbie Banner, MD   [facility]  Cardiologist:  Tobias Alexander, MD  Advanced Practice Provider:  No care team member to display Electrophysiologist:  None   Referring MD: Barbie Banner, MD    History of Present Illness:    Joseph Jenkins is a 71 y.o. male with a hx of CAD s/p DES to mLAD 05/2019, HTN and HLD who was previously followed by Dr. Delton See who now presents to clinic for follow-up.   Patient admitted 05/2019 for unstable angina. TTE showed LVEF of 50 to 55%. Cardiac catheterization showed severe proximal LAD stenosis s/p PCI with DES. Otherwise, mild nonobstructive disease. Recommended dual antiplatelet therapy with aspirin and Brilinta for 12 months.  No beta-blocker given bradycardia. Placed on statin therapy.  Was last seen in clinic on 07/2020 where he was doing well. Was having muscle aches to the crestor and dose was decreased to 10mg  daily.   Today, he is doing well. He continues to be active walking, doing push-ups, and riding his motorcycle with no exertional symptoms. He experienced myalgias on Crestor. He has tried cutting his Lipitor in quarter tablets in the past and the side-effects were more bearable. He reports he can tolerate lower-doses. Currently, he takes Omega-3 and herbal supplements to control his cholesterol. His latest home blood pressure was 118/78.   He denies chest pain, chest pressure, dyspnea at rest or with exertion, palpitations, PND, orthopnea, or leg swelling. He denies bleeding complications on aspirin.   Past Medical History:  Diagnosis Date   CAD (coronary artery disease), native coronary artery    S/p DES to mid LAD 06/15/2019   Hyperlipidemia LDL goal <70    Hypertension     Past Surgical History:  Procedure Laterality Date   CORONARY STENT INTERVENTION N/A 06/15/2019   Procedure: CORONARY STENT  INTERVENTION;  Surgeon: Tonny Bollman, MD;  Location: [facility] INVASIVE CV LAB;  Service: Cardiovascular;  Laterality: N/A;   LEFT HEART CATH AND CORONARY ANGIOGRAPHY N/A 06/15/2019   Procedure: LEFT HEART CATH AND CORONARY ANGIOGRAPHY;  Surgeon: Tonny Bollman, MD;  Location: [facility] INVASIVE CV LAB;  Service: Cardiovascular;  Laterality: N/A;    Current Medications: Current Meds  Medication Sig   amLODipine (NORVASC) 5 MG tablet Take 1 tablet (5 mg total) by mouth daily.   aspirin EC 81 MG tablet Take 81 mg by mouth daily.   Lactobacillus (PROBIOTIC ACIDOPHILUS PO) Take 1 tablet by mouth.   Multiple Vitamin (MULTIVITAMIN PO) Take by mouth.     Allergies:   Bee venom   Social History   Socioeconomic History   Marital status: Married    Spouse name: Not on file   Number of children: Not on file   Years of education: Not on file   Highest education level: Not on file  Occupational History   Not on file  Tobacco Use   Smoking status: Never   Smokeless tobacco: Never  Vaping Use   Vaping Use: Never used  Substance and Sexual Activity   Alcohol use: Not Currently   Drug use: Never   Sexual activity: Not on file  Other Topics Concern   Not on file  Social History Narrative   Not on file   Social Determinants of Health   Financial Resource Strain: Not on file  Food Insecurity: Not on file  Transportation Needs: Not  on file  Physical Activity: Not on file  Stress: Not on file  Social Connections: Not on file     Family History: The patient's family history includes Hyperlipidemia in his father; Hypertension in his father.  ROS:   Please see the history of present illness.    Review of Systems  Constitutional:  Negative for chills and fever.  HENT:  Negative for hearing loss and sore throat.   Eyes:  Negative for blurred vision and redness.  Respiratory:  Negative for shortness of breath.   Cardiovascular:  Negative for chest pain, palpitations, orthopnea, claudication, leg  swelling and PND.  Gastrointestinal:  Negative for melena, nausea and vomiting.  Genitourinary:  Negative for flank pain and hematuria.  Musculoskeletal:  Negative for myalgias.  Neurological:  Negative for dizziness and loss of consciousness.  Endo/Heme/Allergies:  Negative for polydipsia.  Psychiatric/Behavioral:  The patient is not nervous/anxious.    EKGs/Labs/Other Studies Reviewed:    The following studies were reviewed today: TTE Jul 02, 2019: IMPRESSIONS   1. Distal septal and inferior basal hypokinesis . Left ventricular  ejection fraction, by estimation, is 50 to 55%. The left ventricle has low  normal function. The left ventricle demonstrates regional wall motion  abnormalities (see scoring  diagram/findings for description). There is mild left ventricular  hypertrophy. Left ventricular diastolic parameters were normal.   2. Right ventricular systolic function is normal. The right ventricular  size is normal.   3. The mitral valve is normal in structure. Trivial mitral valve  regurgitation. No evidence of mitral stenosis.   4. The aortic valve is tricuspid. Aortic valve regurgitation is mild. No  aortic stenosis is present.   5. The inferior vena cava is normal in size with greater than 50%  respiratory variability, suggesting right atrial pressure of 3 mmHg.   Distal septal and inferior basal hypokinesis  Cath 2019/07/02: 1.  Severe proximal to mid LAD stenosis, treated successfully with PCI using a 3.0 x 22 mm resolute Onyx DES 2.  Mild nonobstructive left main, left circumflex, and RCA stenosis 3.  Normal LVEDP 4.  Normal LV function by echo assessment   Recommendations: Dual antiplatelet therapy with aspirin and ticagrelor x12 months.  Aggressive risk reduction measures.  Should be stable for hospital discharge tomorrow morning unless any problems arise.   EKG:  06/17/21: Sinus bradycardia, rate 57 bpm  Recent Labs: 08/15/2020: BUN 18; Creatinine, Ser 1.15; Potassium  4.6; Sodium 140  Recent Lipid Panel    Component Value Date/Time   CHOL 125 08/15/2020 0747   TRIG 70 08/15/2020 0747   HDL 54 08/15/2020 0747   CHOLHDL 2.3 08/15/2020 0747   CHOLHDL 4.5 06/14/2019 1641   VLDL 14 06/14/2019 1641   LDLCALC 57 08/15/2020 0747      Physical Exam:    VS:  BP 132/80   Pulse (!) 57   Ht 5\' 10"  (1.778 m)   Wt 195 lb 12.8 oz (88.8 kg)   SpO2 97%   BMI 28.09 kg/m     Wt Readings from Last 3 Encounters:  06/17/21 195 lb 12.8 oz (88.8 kg)  08/19/20 192 lb 12.8 oz (87.5 kg)  05/12/20 197 lb (89.4 kg)     GEN:  Well nourished, well developed in no acute distress HEENT: Normal NECK: No JVD; No carotid bruits CARDIAC: Bradycardic Regular, no murmurs, rubs, or gallops RESPIRATORY:  Clear to auscultation without rales, wheezing or rhonchi  ABDOMEN: Soft, non-tender, non-distended MUSCULOSKELETAL:  No edema; No deformity  SKIN:  Warm and dry NEUROLOGIC:  Alert and oriented x 3 PSYCHIATRIC:  Normal affect   ASSESSMENT:    1. Coronary artery disease involving native coronary artery of native heart without angina pectoris   2. Medication management   3. Hyperlipidemia, unspecified hyperlipidemia type   4. Non-ST elevation (NSTEMI) myocardial infarction (HCC)   5. Essential hypertension      PLAN:    In order of problems listed above:  #CAD s/p DES to LAD: Patient admitted 05/2019 for unstable angina. TTE showed LV function of 50 to 55%. Cardiac catheterization showed severe proximal LAD stenosis s/p PCI with DES. Otherwise, mild nonobstructive disease. No anginal symptoms.  -Continue ASA 81mg  indefinitely -Stopped crestor due to myalgias; amenable to restart at lower dose pending lipids -Valsartan stopped by patient as he stated that amlodipine worked better for his blood pressure -No BB due to bradycardia  #HTN: Controlled and at goal at home. -Continue amlodipine 5mg  daily  #HLD: -Stopped crestor due to myalgias; amenable to restart at  lower dose pending lipids -Check lipid profile when fasting  #Mild AI: Noted on TTE on 06/15/19. -Serial monitoring with next TTE 05/2022  Medication Adjustments/Labs and Tests Ordered: Current medicines are reviewed at length with the patient today.  Concerns regarding medicines are outlined above.  Orders Placed This Encounter  Procedures   Lipid Profile   EKG 12-Lead    No orders of the defined types were placed in this encounter.    Patient Instructions  Medication Instructions:   Your physician recommends that you continue on your current medications as directed. Please refer to the Current Medication list given to you today.  *If you need a refill on your cardiac medications before your next appointment, please call your pharmacy*   Lab Work:  NEXT WEEK HERE IN THE OFFICE--CHECK LIPIDS--COME FASTING TO THIS LAB APPOINTMENT  If you have labs (blood work) drawn today and your tests are completely normal, you will receive your results only by: MyChart Message (if you have MyChart) OR A paper copy in the mail If you have any lab test that is abnormal or we need to change your treatment, we will call you to review the results.   Follow-Up: At [facility], you and your health needs are our priority.  As part of our continuing mission to provide you with exceptional heart care, we have created designated Provider Care Teams.  These Care Teams include your primary Cardiologist (physician) and Advanced Practice Providers (APPs -  Physician Assistants and Nurse Practitioners) who all work together to provide you with the care you need, when you need it.  We recommend signing up for the patient portal called "MyChart".  Sign up information is provided on this After Visit Summary.  MyChart is used to connect with patients for Virtual Visits (Telemedicine).  Patients are able to view lab/test results, encounter notes, upcoming appointments, etc.  Non-urgent messages can be sent to  your provider as well.   To learn more about what you can do with MyChart, go to [email or web address].    Your next appointment:   1 year(s)  The format for your next appointment:   In Person  Provider:   DR. Shari Prows  Important Information About Sugar        I,Mykaella Javier,acting as a scribe for Meriam Sprague, MD.,have documented all relevant documentation on the behalf of Meriam Sprague, MD,as directed by  Meriam Sprague, MD while in the presence of Meriam Sprague, MD.  I, Meriam Sprague, MD, have reviewed all documentation for this visit. The documentation on 06/17/21 for the exam, diagnosis, procedures, and orders are all accurate and complete.   Signed, Meriam Sprague, MD  06/17/2021 9:37 AM    [facility]

## 2021-06-18 ENCOUNTER — Other Ambulatory Visit: Payer: Medicare HMO

## 2021-06-18 DIAGNOSIS — E785 Hyperlipidemia, unspecified: Secondary | ICD-10-CM

## 2021-06-18 DIAGNOSIS — Z79899 Other long term (current) drug therapy: Secondary | ICD-10-CM

## 2021-06-18 DIAGNOSIS — I2510 Atherosclerotic heart disease of native coronary artery without angina pectoris: Secondary | ICD-10-CM

## 2021-06-18 LAB — LIPID PANEL
Chol/HDL Ratio: 4 ratio (ref 0.0–5.0)
Cholesterol, Total: 195 mg/dL (ref 100–199)
HDL: 49 mg/dL (ref >39)
LDL Chol Calc (NIH): 131 mg/dL — ABNORMAL HIGH (ref 0–99)
Triglycerides: 83 mg/dL (ref 0–149)
VLDL Cholesterol Cal: 15 mg/dL (ref 5–40)

## 2021-06-19 ENCOUNTER — Telehealth: Payer: Self-pay | Admitting: *Deleted

## 2021-06-19 DIAGNOSIS — E785 Hyperlipidemia, unspecified: Secondary | ICD-10-CM

## 2021-06-19 DIAGNOSIS — I2510 Atherosclerotic heart disease of native coronary artery without angina pectoris: Secondary | ICD-10-CM

## 2021-06-19 DIAGNOSIS — Z79899 Other long term (current) drug therapy: Secondary | ICD-10-CM

## 2021-06-19 MED ORDER — ROSUVASTATIN CALCIUM 10 MG PO TABS: 10.0000 mg | ORAL_TABLET | Freq: Every day | ORAL | 4 refills | Status: DC

## 2021-06-19 NOTE — Telephone Encounter (Signed)
The patient has been notified of the result and verbalized understanding.  All questions (if any) were answered. Loa Socks, LPN 04/19/3333 45:62 AM  ? ?Pt states he will restart taking crestor 10 mg po daily. ?Confirmed the pharmacy of choice with the pt.  ?Pt aware to notify us if he has any issues with restarting this med.   ?Pt verbalized understanding and agrees with this plan. ?

## 2021-06-19 NOTE — Telephone Encounter (Signed)
-----   Message from Macie Burows, RN sent at 06/19/2021  9:44 AM EDT ----- ? ?----- Message ----- ?From: Meriam Sprague, MD ?Sent: 06/18/2021   8:53 PM EDT ?To: [facility] Triage ? ?His LDL cholesterol went from 57 to 131 off the statin. Does he want to retry the statin at a lower dose (Crestor 5 or 10mg  daily?). Or would he like to be referred to lipid clinic to discuss non-statin alternatives? ? ?

## 2021-07-23 ENCOUNTER — Other Ambulatory Visit: Payer: Self-pay | Admitting: *Deleted

## 2021-07-23 MED ORDER — AMLODIPINE BESYLATE 5 MG PO TABS: 5.0000 mg | ORAL_TABLET | Freq: Every day | ORAL | 3 refills | Status: DC

## 2021-09-18 ENCOUNTER — Telehealth: Payer: Self-pay | Admitting: Cardiology

## 2021-09-18 DIAGNOSIS — Z789 Other specified health status: Secondary | ICD-10-CM

## 2021-09-18 DIAGNOSIS — Z79899 Other long term (current) drug therapy: Secondary | ICD-10-CM

## 2021-09-18 DIAGNOSIS — E785 Hyperlipidemia, unspecified: Secondary | ICD-10-CM

## 2021-09-18 DIAGNOSIS — I214 Non-ST elevation (NSTEMI) myocardial infarction: Secondary | ICD-10-CM

## 2021-09-18 DIAGNOSIS — I2510 Atherosclerotic heart disease of native coronary artery without angina pectoris: Secondary | ICD-10-CM

## 2021-09-18 MED ORDER — ATORVASTATIN CALCIUM 10 MG PO TABS: 10.0000 mg | ORAL_TABLET | Freq: Every day | ORAL | 1 refills | Status: DC

## 2021-09-18 NOTE — Telephone Encounter (Signed)
refer to lipid clinic per Dr. Shari Prows Received: Today Loletta Parish, LPN Patient is scheduled 09/25/21

## 2021-09-18 NOTE — Telephone Encounter (Signed)
Pts wife is calling to let Dr. Shari Prows know that the pt is having side effects with taking crestor.  Wife states he complains of being super achy all over in the mornings when he gets up.  Wife states he complains that crestor makes him feel unwell.   Wife is asking if Dr. Shari Prows can advise on an alternative regimen in place of crestor.   Wife is aware that I will route this message to Dr. Shari Prows to further advise on.  She is aware that I will follow-up with them accordingly thereafter.   Wife verbalized understanding and agrees with this plan.

## 2021-09-18 NOTE — Telephone Encounter (Signed)
Joseph Sprague, MD  Joseph Socks, LPN Caller: Unspecified (Today, 10:11 AM) Can we trial switching him to lipitor 10mg  daily and get him in with lipid clinic to see if he is eligible for non-statin alternatives?   Spoke back with the pts wife.  Endorsed to her that per Dr. , we will d/c his crestor and switch him to lipitor 10 mg po daily.  Wife is also aware that we will refer the pt to our lipid clinic to see if he would be eligible for non-statin alternatives.  Confirmed the pharmacy of choice with the pts wife.  She is aware that I will place the referral to lipid clinic in the system and send a message to our [facility] Schedulers to call them back to arrange this appt. Wife verbalized understanding and agrees with this plan.

## 2021-09-18 NOTE — Telephone Encounter (Signed)
Pt c/o medication issue:  1. Name of Medication:   rosuvastatin (CRESTOR) 10 MG tablet Take 1 tablet (10 mg total) by mouth daily.      2. How are you currently taking this medication (dosage and times per day)? Take 1 tablet (10 mg total) by mouth daily.  3. Are you having a reaction (difficulty breathing--STAT)? No  4. What is your medication issue? States that he does not like the way medication makes him feel. Would like to know if another can be prescribed. Please advise

## 2021-09-24 NOTE — Progress Notes (Unsigned)
Patient ID: Joseph Jenkins                 DOB: 03-Jul-1950                    MRN: [id number]     HPI: Joseph Jenkins is a 71 y.o. male patient referred to lipid clinic by Dr. Shari Prows. PMH is significant for CAD s/p DES to mLAD 05/2019, HLD, and HTN. In the past patient has tried cutting Lipitor tablets into quarters and was able to tolerate the side effects better. Patient was most recently on Crestor but was experiencing myalgias so stopped taking it. Lipid panel was obtained 06/18/21 after being off of statins. Now the patient is taking atorvastatin 10 mg daily which he was switched to after his lipid panel.   Ask if taking omega 3 (not on med list but in Dr Devin Going note)  Consider [phone] (other options wont get to goal)  Once on pscki could consider stopping statin if muscle pains continue to bother patient  Ask about diet.exercise   Current Medications: atorvastatin 10 mg daily  Intolerances: rosuvastatin 10 mg daily, atorvastatin 80 mg daily (muscle aches)  Risk Factors: CAD, HTN, HLD  LDL goal: <70  Diet:   Exercise:   Family History: father- HLD, HTN   Social History: no smoking, no alcohol use   Labs: 06/18/21 lipid panel - LDL 131, HDL 49, TG 83, TC 195 (not on statin)  Past Medical History:  Diagnosis Date   CAD (coronary artery disease), native coronary artery    S/p DES to mid LAD 06/15/2019   Hyperlipidemia LDL goal <70    Hypertension     Current Outpatient Medications on File Prior to Visit  Medication Sig Dispense Refill   amLODipine (NORVASC) 5 MG tablet Take 1 tablet (5 mg total) by mouth daily. 90 tablet 3   aspirin EC 81 MG tablet Take 81 mg by mouth daily.     atorvastatin (LIPITOR) 10 MG tablet Take 1 tablet (10 mg total) by mouth daily. 90 tablet 1   Lactobacillus (PROBIOTIC ACIDOPHILUS PO) Take 1 tablet by mouth.     Multiple Vitamin (MULTIVITAMIN PO) Take by mouth.     nitroGLYCERIN (NITROSTAT) 0.4 MG SL tablet Place 1 tablet (0.4 mg total) under  the tongue every 5 (five) minutes as needed. 25 tablet 6   No current facility-administered medications on file prior to visit.    Allergies  Allergen Reactions   Bee Venom    Crestor [Rosuvastatin] Other (See Comments)    Reports causes muscle aches    Assessment/Plan:  1. Hyperlipidemia -   LDL of 131 not at goal    Thank you,   Olene Floss, Pharm.D, BCPS, CPP [facility]  [address] [place], [place] [address]  Phone: [phone]; Fax: [phone]

## 2021-09-25 ENCOUNTER — Ambulatory Visit: Payer: Medicare HMO

## 2022-07-15 ENCOUNTER — Other Ambulatory Visit: Payer: Self-pay

## 2022-07-15 MED ORDER — AMLODIPINE BESYLATE 5 MG PO TABS: 5.0000 mg | ORAL_TABLET | Freq: Every day | ORAL | 0 refills | Status: DC

## 2022-08-09 ENCOUNTER — Other Ambulatory Visit: Payer: Self-pay

## 2022-08-09 MED ORDER — AMLODIPINE BESYLATE 5 MG PO TABS: 5.0000 mg | ORAL_TABLET | Freq: Every day | ORAL | 0 refills | Status: DC

## 2022-08-19 ENCOUNTER — Other Ambulatory Visit: Payer: Self-pay

## 2022-08-19 MED ORDER — AMLODIPINE BESYLATE 5 MG PO TABS: 5.0000 mg | ORAL_TABLET | Freq: Every day | ORAL | 0 refills | Status: DC

## 2022-08-19 NOTE — Progress Notes (Signed)
Cardiology Office Note:  .   Date:  08/20/2022  ID:  [name], DOB Apr 04, 1950, MRN [id number] PCP: Barbie Banner, MD  [facility] Providers Cardiologist:  Meriam Sprague, MD    Patient Profile: .      PMH: CAD DES to mLAD 05/2019, otherwise mild nonobstructive disease TTE LVEF 50 to 55%, distal septal and inferior basal hypokinesis, mild LVH Hypertension Hyperlipidemia Myalgias on Crestor Able to tolerate low-dose atorvastatin Aortic insufficiency Mild AI on TTE 06/15/2019  Last cardiology clinic visit was 06/17/21 with Dr. Shari Prows. Advised to have repeat TTE 05/2022. LDL was 131 off statin. Agreeable to restart rosuvastatin 10 mg daily. Called back to report intolerance of rosuvastatin on 09/18/21, advised to start atorvastatin 10 mg daily and referred to lipid clinic.       History of Present Illness: .   Joseph Jenkins is a very pleasant 72 y.o. male who is here alone today for follow-up. Reports feeling well. Has noted HR 30s to 40s when he wakes up. He often wakes up feeling fatigued. HR gradually increases and responds appropriately to exercise with HR 130s-140s typically with pickleball. BP this morning was 119/78 mmHg. Remains very active with pickleball, yard work, and walking for exercise.  Continues to work as a Education officer, environmental. He and his wife have been married 51 years, like to ride his [place].   ROS: + fatigue       Studies Reviewed: Marland Kitchen   EKG Interpretation Date/Time:  Friday August 20 2022 14:13:44 EDT Ventricular Rate:  59 PR Interval:  228 QRS Duration:  86 QT Interval:  404 QTC Calculation: 399 R Axis:   176  Text Interpretation: Unusual P axis, possible ectopic atrial bradycardia Left posterior fascicular block Inferior infarct , age undetermined When compared with ECG of 16-Jun-2019 07:01, Ectopic atrial rhythm has replaced Sinus rhythm Left posterior fascicular block is now Present T wave inversion now evident in Lateral leads No acute changes  Confirmed by Eligha Bridegroom [phone]) on 08/20/2022 2:45:44 PM     Risk Assessment/Calculations:             Physical Exam:   VS:  BP 120/80   Pulse (!) 59   Ht 5\' 10"  (1.778 m)   Wt 201 lb 6.4 oz (91.4 kg)   SpO2 98%   BMI 28.90 kg/m    Wt Readings from Last 3 Encounters:  08/20/22 201 lb 6.4 oz (91.4 kg)  06/17/21 195 lb 12.8 oz (88.8 kg)  08/19/20 192 lb 12.8 oz (87.5 kg)    GEN: Well nourished, well developed in no acute distress NECK: No JVD; No carotid bruits CARDIAC: RRR, no murmurs, rubs, gallops RESPIRATORY:  Clear to auscultation without rales, wheezing or rhonchi  ABDOMEN: Soft, non-tender, non-distended EXTREMITIES:  No edema; No deformity     ASSESSMENT AND PLAN: .    CAD without angina: S/p DES to mLAD 05/2019. He denies chest pain, dyspnea, or other symptoms concerning for angina.  No indication for further ischemic evaluation at this time. Lengthy discussion about cholesterol management. As noted below, will start Zetia 10 mg daily and reassess lipids in 2-3 months.   Bradycardia: He reports low HR 30s to 40s at times when he wakes up. Makes him feel fatigued. Symptoms do not persist once he is awake. Resting HR usually in the 50s. He is very active on a daily basis and has not had any activity intolerance. Is able to get HR 130s-140s with exercise. No presyncope or  syncope. He is not on any AV nodal blocking agents. Advised him to notify us if he develops worsening symptoms.   Hypertension: BP is well controlled.   Hyperlipidemia LDL goal < 55: LDL 122 on 1/61/09. Lengthy discussion about cholesterol goal 55 or lower.  He reports intolerance to statins even at low dose. Specifically did not tolerate atorvastatin 10 mg daily or low dose rosuvastatin. Recommendation to start PCSK9i for LDL reduction 40% or greater. He would like to first try ezetimibe 10 mg daily. We will recheck lipid panel/CMET in 2-3 months.   Aortic insufficiency: Mild AI on echo 06/15/19.  He is  asymptomatic.  We will continue to monitor clinically for now. Consider repeat echo in one year, sooner if clinically indicated.        Dispo: 1 year with me  Signed, Eligha Bridegroom, NP-C

## 2022-08-20 ENCOUNTER — Encounter: Payer: Self-pay | Admitting: Nurse Practitioner

## 2022-08-20 ENCOUNTER — Ambulatory Visit: Payer: Medicare HMO | Attending: Nurse Practitioner | Admitting: Nurse Practitioner

## 2022-08-20 VITALS — BP 120/80 | HR 59 | Ht 70.0 in | Wt 201.4 lb

## 2022-08-20 DIAGNOSIS — I2510 Atherosclerotic heart disease of native coronary artery without angina pectoris: Secondary | ICD-10-CM

## 2022-08-20 DIAGNOSIS — I252 Old myocardial infarction: Secondary | ICD-10-CM

## 2022-08-20 DIAGNOSIS — I351 Nonrheumatic aortic (valve) insufficiency: Secondary | ICD-10-CM

## 2022-08-20 DIAGNOSIS — E7800 Pure hypercholesterolemia, unspecified: Secondary | ICD-10-CM

## 2022-08-20 DIAGNOSIS — Z79899 Other long term (current) drug therapy: Secondary | ICD-10-CM | POA: Diagnosis not present

## 2022-08-20 DIAGNOSIS — I10 Essential (primary) hypertension: Secondary | ICD-10-CM

## 2022-08-20 DIAGNOSIS — R001 Bradycardia, unspecified: Secondary | ICD-10-CM

## 2022-08-20 MED ORDER — EZETIMIBE 10 MG PO TABS: 10.0000 mg | ORAL_TABLET | Freq: Every day | ORAL | 3 refills | Status: AC

## 2022-08-20 NOTE — Patient Instructions (Addendum)
Medication Instructions:   START TAKING: ZETIA 10 MG ONCE A DAY   *If you need a refill on your cardiac medications before your next appointment, please call your pharmacy*   Lab Work:  RETURN IN 2- 3 MONTHS FOR FASTNG LABS CMET AND LIPIDS    If you have labs (blood work) drawn today and your tests are completely normal, you will receive your results only by: MyChart Message (if you have MyChart) OR A paper copy in the mail If you have any lab test that is abnormal or we need to change your treatment, we will call you to review the results.   Testing/Procedures: NONE ORDERED  TODAY     Follow-Up: At [facility], you and your health needs are our priority.  As part of our continuing mission to provide you with exceptional heart care, we have created designated Provider Care Teams.  These Care Teams include your primary Cardiologist (physician) and Advanced Practice Providers (APPs -  Physician Assistants and Nurse Practitioners) who all work together to provide you with the care you need, when you need it.  We recommend signing up for the patient portal called "MyChart".  Sign up information is provided on this After Visit Summary.  MyChart is used to connect with patients for Virtual Visits (Telemedicine).  Patients are able to view lab/test results, encounter notes, upcoming appointments, etc.  Non-urgent messages can be sent to your provider as well.   To learn more about what you can do with MyChart, go to [email or web address].    Your next appointment:   1 year(s)  Provider:      Eligha Bridegroom, NP   Other Instructions

## 2022-08-23 ENCOUNTER — Other Ambulatory Visit: Payer: Self-pay

## 2022-08-23 MED ORDER — AMLODIPINE BESYLATE 5 MG PO TABS: 5.0000 mg | ORAL_TABLET | Freq: Every day | ORAL | 3 refills | Status: DC

## 2022-11-03 ENCOUNTER — Encounter: Payer: Self-pay | Admitting: Nurse Practitioner

## 2022-11-08 ENCOUNTER — Ambulatory Visit: Payer: Medicare HMO

## 2023-12-08 ENCOUNTER — Other Ambulatory Visit: Payer: Self-pay | Admitting: Nurse Practitioner

## 2023-12-08 MED ORDER — AMLODIPINE BESYLATE 5 MG PO TABS: 5.0000 mg | ORAL_TABLET | Freq: Every day | ORAL | 0 refills | Status: AC
